# Patient Record
Sex: Female | Born: 1970 | Hispanic: Yes | Marital: Single | Smoking: Never smoker
Health system: Southern US, Community
[De-identification: ages and names within clinical notes are randomized; demographics above are authoritative.]

## PROBLEM LIST (undated history)

## (undated) DIAGNOSIS — N946 Dysmenorrhea, unspecified: Secondary | ICD-10-CM

## (undated) DIAGNOSIS — G43829 Menstrual migraine, not intractable, without status migrainosus: Secondary | ICD-10-CM

## (undated) DIAGNOSIS — N920 Excessive and frequent menstruation with regular cycle: Secondary | ICD-10-CM

## (undated) DIAGNOSIS — B019 Varicella without complication: Secondary | ICD-10-CM

## (undated) DIAGNOSIS — K219 Gastro-esophageal reflux disease without esophagitis: Secondary | ICD-10-CM

## (undated) HISTORY — DX: Varicella without complication: B01.9

## (undated) HISTORY — DX: Menstrual migraine, not intractable, without status migrainosus: G43.829

## (undated) HISTORY — DX: Excessive and frequent menstruation with regular cycle: N92.0

## (undated) HISTORY — DX: Dysmenorrhea, unspecified: N94.6

## (undated) HISTORY — DX: Gastro-esophageal reflux disease without esophagitis: K21.9

---

## 2002-01-13 HISTORY — PX: TUBAL LIGATION: SHX77

## 2011-12-18 ENCOUNTER — Emergency Department: Payer: Self-pay | Admitting: Internal Medicine

## 2011-12-20 LAB — BETA STREP CULTURE(ARMC)

## 2014-08-09 ENCOUNTER — Encounter: Payer: Self-pay | Admitting: Family Medicine

## 2014-08-09 ENCOUNTER — Ambulatory Visit (INDEPENDENT_AMBULATORY_CARE_PROVIDER_SITE_OTHER): Payer: No Typology Code available for payment source | Admitting: Family Medicine

## 2014-08-09 ENCOUNTER — Encounter (INDEPENDENT_AMBULATORY_CARE_PROVIDER_SITE_OTHER): Payer: Self-pay

## 2014-08-09 VITALS — BP 110/72 | HR 73 | Temp 97.7°F | Ht 62.25 in | Wt 117.2 lb

## 2014-08-09 DIAGNOSIS — R1013 Epigastric pain: Secondary | ICD-10-CM | POA: Diagnosis not present

## 2014-08-09 DIAGNOSIS — R030 Elevated blood-pressure reading, without diagnosis of hypertension: Secondary | ICD-10-CM

## 2014-08-09 DIAGNOSIS — Z13220 Encounter for screening for lipoid disorders: Secondary | ICD-10-CM | POA: Diagnosis not present

## 2014-08-09 DIAGNOSIS — Z0000 Encounter for general adult medical examination without abnormal findings: Secondary | ICD-10-CM | POA: Diagnosis not present

## 2014-08-09 LAB — CBC
HEMATOCRIT: 37.4 % (ref 36.0–46.0)
HEMOGLOBIN: 12.5 g/dL (ref 12.0–15.0)
MCHC: 33.4 g/dL (ref 30.0–36.0)
MCV: 92.2 fl (ref 78.0–100.0)
Platelets: 295 10*3/uL (ref 150.0–400.0)
RBC: 4.06 Mil/uL (ref 3.87–5.11)
RDW: 14.4 % (ref 11.5–15.5)
WBC: 9.1 10*3/uL (ref 4.0–10.5)

## 2014-08-09 LAB — COMPLETE METABOLIC PANEL WITH GFR
ALT: 8 U/L (ref 6–29)
AST: 17 U/L (ref 10–35)
Albumin: 4.2 g/dL (ref 3.6–5.1)
Alkaline Phosphatase: 49 U/L (ref 33–115)
BUN: 12 mg/dL (ref 7–25)
CO2: 24 meq/L (ref 20–31)
Calcium: 9 mg/dL (ref 8.6–10.2)
Chloride: 102 mEq/L (ref 98–110)
Creat: 0.67 mg/dL (ref 0.50–1.10)
GFR, Est African American: >89 mL/min (ref >=60)
GFR, Est Non African American: >89 mL/min (ref >=60)
Glucose, Bld: 80 mg/dL (ref 65–99)
POTASSIUM: 4.4 meq/L (ref 3.5–5.3)
SODIUM: 135 meq/L (ref 135–146)
TOTAL PROTEIN: 7.1 g/dL (ref 6.1–8.1)
Total Bilirubin: 0.6 mg/dL (ref 0.2–1.2)

## 2014-08-09 LAB — LIPID PANEL
CHOL/HDL RATIO: 2
Cholesterol: 200 mg/dL (ref 0–200)
HDL: 81.9 mg/dL (ref >39.00)
LDL CALC: 102 mg/dL — AB (ref 0–99)
NonHDL: 118.1
Triglycerides: 83 mg/dL (ref 0.0–149.0)
VLDL: 16.6 mg/dL (ref 0.0–40.0)

## 2014-08-09 LAB — LIPASE: LIPASE: 26 U/L (ref 11.0–59.0)

## 2014-08-09 MED ORDER — OMEPRAZOLE 40 MG PO CPDR: 40.0000 mg | DELAYED_RELEASE_CAPSULE | Freq: Every day | ORAL | Status: DC

## 2014-08-09 NOTE — Assessment & Plan Note (Signed)
Pap smear and immunizations up-to-date. Patient need a mammogram. Order placed.

## 2014-08-09 NOTE — Assessment & Plan Note (Signed)
History exam consistent with gastritis. Treating with omeprazole. Rx given today. Patient is to avoid alcohol and to stop NSAID use. Patient to follow-up in one month.

## 2014-08-09 NOTE — Patient Instructions (Signed)
It was nice to see you today.  Take the medication daily as prescribed.  Follow up in 1 month.  Take care  Dr. Lacinda Axon

## 2014-08-09 NOTE — Progress Notes (Signed)
   Subjective:    Patient ID: Denise Gordon, female    DOB: [birth year], 44 y.o.   MRN: [id number]  HPI 44 year old female presents to clinic today to establish care.  Gyn concerns/Preventative healthcare  Regular periods: Yes.   Sexually active: yes.   Contraception or hormonal therapy: No. S/p Tubal ligation.   Last mammogram: 2014; Normal.   Breast mass or concerns: No.    Last pap smear: 2014.   Immunizations: Tetatus (2015); No indication for Pneumococcal vaccine.   History of elevated BP  Patient reports that she had recently elevated blood pressure while at work.  She has no history of this.  She is on no medications for high blood pressure.  She is otherwise feeling well; no reports of chest pain, shortness of breath.  Epigastric pain  Patient reports that she has had epigastric discomfort for the past 3 weeks intermittently.  Pain is mild to moderate severity.  No known relieving factors.  She has noted that he has been worse with certain meals and after eating.  Additionally, she reports that she takes NSAIDs on a regular basis (5-6 tablets of ibuprofen 3 times a week; she states she has done this for years).  She also reports some associated reflux.  No nausea or vomiting. No reports of hematochezia or melanoma.  PMH, Surgical Hx, Family Hx, Social History reviewed and updated as below.  Past Medical History  Diagnosis Date  . Blood pressure elevated without history of HTN    Past Surgical History  Procedure Laterality Date  . Tubal ligation     Family History  Problem Relation Age of Onset  . Hypertension Mother   . Cancer Maternal Grandmother     ovary/uterus   History   Social History  . Marital Status: Married    Spouse Name: N/A  . Number of Children: N/A  . Years of Education: N/A   Social History Main Topics  . Smoking status: Never Smoker   . Smokeless tobacco: Never Used  . Alcohol Use: No  . Drug Use: No  . Sexual  Activity: Yes   Other Topics Concern  . None   Social History Narrative  . None   Review of Systems Per HPI. All other systems negative.    Objective:   Physical Exam  Constitutional: She is oriented to person, place, and time. She appears well-developed and well-nourished. No distress.  HENT:  Head: Normocephalic.  Nose: Nose normal.  Mouth/Throat: Oropharynx is clear and moist. No oropharyngeal exudate.  Normal TM's bilaterally.   Eyes: No scleral icterus.  Neck: Neck supple. No thyromegaly present.  Cardiovascular: Normal rate and regular rhythm.   No murmur heard. Pulmonary/Chest: Effort normal and breath sounds normal. No respiratory distress. She has no wheezes. She has no rales.  Abdominal: Soft. She exhibits no distension. There is no rebound and no guarding.  Tender to palpation in the epigastric region.   Musculoskeletal: She exhibits no edema.  Lymphadenopathy:    She has no cervical adenopathy.  Neurological: She is alert and oriented to person, place, and time.  Skin: Skin is warm and dry. No rash noted.  Psychiatric: She has a normal mood and affect.  Vitals reviewed.     Assessment & Plan:  See Problem List

## 2014-08-10 ENCOUNTER — Telehealth: Payer: Self-pay

## 2014-08-10 NOTE — Telephone Encounter (Signed)
Called pt about lab results and she was happy with those results. Pt had a mildly elevated cholesterol but nothing to be alarmed about.

## 2014-08-11 ENCOUNTER — Telehealth: Payer: Self-pay | Admitting: Family Medicine

## 2014-08-11 NOTE — Telephone Encounter (Signed)
Pt is wanting to change providers from Dr Lacinda Axon to Dr Danise Mina because she feels more comfortable speaking in [place].  Call back number is [phone]. thanks

## 2014-08-11 NOTE — Telephone Encounter (Signed)
Pt would like to transfer providers from Dr Lacinda Axon, to Dr Danise Mina because she feels more comfortable speaking in [place]. I told her that at both practices she can get an interpreter but she said she wishes to change pcp.  Best phone number is [phone]. Thank you for your time.

## 2014-08-11 NOTE — Telephone Encounter (Signed)
Ok by me if ok by current PCP

## 2014-08-29 NOTE — Telephone Encounter (Signed)
Pt scheduled for 12-12. Pt aware

## 2014-09-01 ENCOUNTER — Telehealth: Payer: Self-pay

## 2014-09-01 LAB — HM MAMMOGRAPHY: HM Mammogram: NEGATIVE

## 2014-09-01 NOTE — Telephone Encounter (Signed)
Pt was called to see if she had followed up with her mammogram . This appt is with [facility].

## 2014-09-08 ENCOUNTER — Encounter: Payer: Self-pay | Admitting: Internal Medicine

## 2014-09-11 ENCOUNTER — Ambulatory Visit: Payer: No Typology Code available for payment source | Admitting: Family Medicine

## 2014-12-25 ENCOUNTER — Ambulatory Visit (INDEPENDENT_AMBULATORY_CARE_PROVIDER_SITE_OTHER): Payer: No Typology Code available for payment source | Admitting: Family Medicine

## 2014-12-25 ENCOUNTER — Encounter: Payer: Self-pay | Admitting: Family Medicine

## 2014-12-25 ENCOUNTER — Encounter (INDEPENDENT_AMBULATORY_CARE_PROVIDER_SITE_OTHER): Payer: Self-pay

## 2014-12-25 VITALS — BP 112/68 | HR 75 | Temp 98.4°F | Ht 61.75 in | Wt 117.0 lb

## 2014-12-25 DIAGNOSIS — N920 Excessive and frequent menstruation with regular cycle: Secondary | ICD-10-CM | POA: Insufficient documentation

## 2014-12-25 DIAGNOSIS — M79675 Pain in left toe(s): Secondary | ICD-10-CM

## 2014-12-25 DIAGNOSIS — K219 Gastro-esophageal reflux disease without esophagitis: Secondary | ICD-10-CM | POA: Diagnosis not present

## 2014-12-25 DIAGNOSIS — G43829 Menstrual migraine, not intractable, without status migrainosus: Secondary | ICD-10-CM

## 2014-12-25 DIAGNOSIS — N943 Premenstrual tension syndrome: Secondary | ICD-10-CM

## 2014-12-25 DIAGNOSIS — N946 Dysmenorrhea, unspecified: Secondary | ICD-10-CM | POA: Insufficient documentation

## 2014-12-25 DIAGNOSIS — N921 Excessive and frequent menstruation with irregular cycle: Secondary | ICD-10-CM | POA: Insufficient documentation

## 2014-12-25 DIAGNOSIS — G43909 Migraine, unspecified, not intractable, without status migrainosus: Secondary | ICD-10-CM | POA: Insufficient documentation

## 2014-12-25 HISTORY — DX: Gastro-esophageal reflux disease without esophagitis: K21.9

## 2014-12-25 LAB — CBC WITH DIFFERENTIAL/PLATELET
BASOS PCT: 0.4 % (ref 0.0–3.0)
Basophils Absolute: 0 10*3/uL (ref 0.0–0.1)
EOS ABS: 0.1 10*3/uL (ref 0.0–0.7)
Eosinophils Relative: 1.2 % (ref 0.0–5.0)
HCT: 39.4 % (ref 36.0–46.0)
Hemoglobin: 12.9 g/dL (ref 12.0–15.0)
Lymphocytes Relative: 27 % (ref 12.0–46.0)
Lymphs Abs: 1.5 10*3/uL (ref 0.7–4.0)
MCHC: 32.8 g/dL (ref 30.0–36.0)
MCV: 91.4 fl (ref 78.0–100.0)
MONO ABS: 0.3 10*3/uL (ref 0.1–1.0)
Monocytes Relative: 5.5 % (ref 3.0–12.0)
Neutro Abs: 3.6 10*3/uL (ref 1.4–7.7)
Neutrophils Relative %: 65.9 % (ref 43.0–77.0)
Platelets: 329 10*3/uL (ref 150.0–400.0)
RBC: 4.31 Mil/uL (ref 3.87–5.11)
RDW: 14.3 % (ref 11.5–15.5)
WBC: 5.4 10*3/uL (ref 4.0–10.5)

## 2014-12-25 MED ORDER — DICLOFENAC SODIUM 1 % TD GEL: 1.0000 "application " | Freq: Three times a day (TID) | TRANSDERMAL | Status: DC

## 2014-12-25 NOTE — Patient Instructions (Addendum)
Nice to meet you today. Call us with questions labwork today. Pass by our referral coordinator to schedule pelvic ultrasound Try voltaren gel for topical anti inflammatory to toe - possible inflamed ganglion cyst.   Dysmenorrhea Menstrual cramps (dysmenorrhea) are caused by the muscles of the uterus tightening (contracting) during a menstrual period. For some women, this discomfort is merely bothersome. For others, dysmenorrhea can be severe enough to interfere with everyday activities for a few days each month. Primary dysmenorrhea is menstrual cramps that last a couple of days when you start having menstrual periods or soon after. This often begins after a teenager starts having her period. As a woman gets older or has a baby, the cramps will usually lessen or disappear. Secondary dysmenorrhea begins later in life, lasts longer, and the pain may be stronger than primary dysmenorrhea. The pain may start before the period and last a few days after the period.  CAUSES  Dysmenorrhea is usually caused by an underlying problem, such as:  The tissue lining the uterus grows outside of the uterus in other areas of the body (endometriosis).  The endometrial tissue, which normally lines the uterus, is found in or grows into the muscular walls of the uterus (adenomyosis).  The pelvic blood vessels are engorged with blood just before the menstrual period (pelvic congestive syndrome).  Overgrowth of cells (polyps) in the lining of the uterus or cervix.  Falling down of the uterus (prolapse) because of loose or stretched ligaments.  Depression.  Bladder problems, infection, or inflammation.  Problems with the intestine, a tumor, or irritable bowel syndrome.  Cancer of the female organs or bladder.  A severely tipped uterus.  A very tight opening or closed cervix.  Noncancerous tumors of the uterus (fibroids).  Pelvic inflammatory disease (PID).  Pelvic scarring (adhesions) from a previous  surgery.  Ovarian cyst.  An intrauterine device (IUD) used for birth control. RISK FACTORS You may be at greater risk of dysmenorrhea if:  You are younger than age 70.  You started puberty early.  You have irregular or heavy bleeding.  You have never given birth.  You have a family history of this problem.  You are a smoker. SIGNS AND SYMPTOMS   Cramping or throbbing pain in your lower abdomen.  Headaches.  Lower back pain.  Nausea or vomiting.  Diarrhea.  Sweating or dizziness.  Loose stools. DIAGNOSIS  A diagnosis is based on your history, symptoms, physical exam, diagnostic tests, or procedures. Diagnostic tests or procedures may include:  Blood tests.  Ultrasonography.  An examination of the lining of the uterus (dilation and curettage, D&C).  An examination inside your abdomen or pelvis with a scope (laparoscopy).  X-rays.  CT scan.  MRI.  An examination inside the bladder with a scope (cystoscopy).  An examination inside the intestine or stomach with a scope (colonoscopy, gastroscopy). TREATMENT  Treatment depends on the cause of the dysmenorrhea. Treatment may include:  Pain medicine prescribed by your health care provider.  Birth control pills or an IUD with progesterone hormone in it.  Hormone replacement therapy.  Nonsteroidal anti-inflammatory drugs (NSAIDs). These may help stop the production of prostaglandins.  Surgery to remove adhesions, endometriosis, ovarian cyst, or fibroids.  Removal of the uterus (hysterectomy).  Progesterone shots to stop the menstrual period.  Cutting the nerves on the sacrum that go to the female organs (presacral neurectomy).  Electric current to the sacral nerves (sacral nerve stimulation).  Antidepressant medicine.  Psychiatric therapy, counseling, or group  therapy.  Exercise and physical therapy.  Meditation and yoga therapy.  Acupuncture. HOME CARE INSTRUCTIONS   Only take  over-the-counter or prescription medicines as directed by your health care provider.  Place a heating pad or hot water bottle on your lower back or abdomen. Do not sleep with the heating pad.  Use aerobic exercises, walking, swimming, biking, and other exercises to help lessen the cramping.  Massage to the lower back or abdomen may help.  Stop smoking.  Avoid alcohol and caffeine. SEEK MEDICAL CARE IF:   Your pain does not get better with medicine.  You have pain with sexual intercourse.  Your pain increases and is not controlled with medicines.  You have abnormal vaginal bleeding with your period.  You develop nausea or vomiting with your period that is not controlled with medicine. SEEK IMMEDIATE MEDICAL CARE IF:  You pass out.    This information is not intended to replace advice given to you by your health care provider. Make sure you discuss any questions you have with your health care provider.   Document Released: 12/30/2004 Document Revised: 09/01/2012 Document Reviewed: 06/17/2012 Elsevier Interactive Patient Education [employer].

## 2014-12-25 NOTE — Assessment & Plan Note (Signed)
With dysmenorrhea and menstrual migraines. Discussed with patient. Check CBC, order transvaginal and pelvic US to r/o fibroids as cause then discuss OCP vs IUD. Pt agrees with plan.

## 2014-12-25 NOTE — Assessment & Plan Note (Signed)
?  intermittent inflamed ganglion cyst. rec prn voltaren gel - if no improvement will refer to podiatry. Pt agrees with plan.

## 2014-12-25 NOTE — Assessment & Plan Note (Signed)
Stable off NSAIDs.

## 2014-12-25 NOTE — Progress Notes (Signed)
BP 112/68 mmHg  Pulse 75  Temp(Src) 98.4 F (36.9 C) (Oral)  Ht 5' 1.75" (1.568 m)  Wt 117 lb (53.071 kg)  BMI 21.59 kg/m2  SpO2 99%  LMP 12/18/2014   CC: new pt to establish care  Subjective:    Patient ID: Denise Gordon, female    DOB: January 18, 1970, 44 y.o.   MRN: [id number]  HPI: Denise Gordon is a 44 y.o. female presenting on 12/25/2014 for Establish Care and Flu Vaccine   Transfer of care from Dr Lacinda Axon at [place] - pt desired spanish speaking provider. Presents with BF Randy today. Prior lived in [place], well woman with PCP.   Dysmenorrhea - present over the past year. + headache described as migraines 1 wk prior and 1 wk afterwards. HA described as frontal, not activity limiting. Nausea/vomiting with them. + photo/phonophobia. HA controlled with tylenol. Significant heavy bleeding first 2 days, bleeding continues for 2 more days. Goes through several packets of pads per period. No intermenstrual bleeding. H/o migraines previously over last 5 yrs.   Last well woman exam with pap was 2 yrs ago.   Also with intermittent small nodule L great toe that is bothersome. Denies inciting trauma/injury.   S/p BTL. LMP 12/14/2014. Last pap smear 2014. Had normal TSH 4-5 mo ago at work.   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 Interim medical history since our last visit reviewed. Allergies and medications reviewed and updated. No current outpatient prescriptions on file prior to visit.   No current facility-administered medications on file prior to visit.    Review of Systems Per HPI unless specifically indicated in ROS section     Objective:    BP 112/68 mmHg  Pulse 75  Temp(Src) 98.4 F (36.9 C) (Oral)  Ht 5' 1.75" (1.568 m)  Wt 117 lb (53.071 kg)  BMI 21.59 kg/m2  SpO2 99%  LMP  12/18/2014  Wt Readings from Last 3 Encounters:  12/25/14 117 lb (53.071 kg)  08/09/14 117 lb 4 oz (53.184 kg)    Physical Exam  Constitutional: She is oriented to person, place, and time. She appears well-developed and well-nourished. No distress.  HENT:  Mouth/Throat: Oropharynx is clear and moist. No oropharyngeal exudate.  Eyes: Conjunctivae and EOM are normal. Pupils are equal, round, and reactive to light. No scleral icterus.  Neck: Normal range of motion. Neck supple. No thyromegaly present.  Cardiovascular: Normal rate, regular rhythm, normal heart sounds and intact distal pulses.   No murmur heard. Pulmonary/Chest: Effort normal and breath sounds normal. No respiratory distress. She has no wheezes. She has no rales.  Abdominal: Soft. Normal appearance and bowel sounds are normal. She exhibits no distension and no mass. There is no hepatosplenomegaly. There is no tenderness. There is no rigidity, no rebound, no guarding, no CVA tenderness and negative Murphy's sign.  Musculoskeletal: She exhibits no edema.  FROM at bilateral toes. Small palpable tender nodule left dorsal toe just distal to MTP. No pain with axial loading. 2+ DP bilaterally  Lymphadenopathy:    She has no cervical adenopathy.  Neurological: She is alert and oriented to person, place, and time.  Skin: Skin is warm and dry. No rash noted.  Psychiatric: She has a normal mood and affect.  Nursing note and vitals reviewed.  Results for orders placed or performed in visit  on 09/05/14  [facility] MAMMOGRAPHY  Result Value Ref Range   [facility] Mammogram Negative       Assessment & Plan:   Problem List Items Addressed This Visit    Toe pain, left    ?intermittent inflamed ganglion cyst. rec prn voltaren gel - if no improvement will refer to podiatry. Pt agrees with plan.      Menstrual migraine - Primary    Discussed - pt feels stable on prn tylenol. Pt avoids NSAIDs 2/2 GERD history      Menorrhagia with regular cycle     With dysmenorrhea and menstrual migraines. Discussed with patient. Check CBC, order transvaginal and pelvic US to r/o fibroids as cause then discuss OCP vs IUD. Pt agrees with plan.      Relevant Orders   CBC with Differential/Platelet   US Pelvis Complete   US Transvaginal Non-OB   GERD (gastroesophageal reflux disease)    Stable off NSAIDs.          Follow up plan: Return if symptoms worsen or fail to improve.

## 2014-12-25 NOTE — Progress Notes (Signed)
Pre visit review using our clinic review tool, if applicable. No additional management support is needed unless otherwise documented below in the visit note. 

## 2014-12-25 NOTE — Assessment & Plan Note (Addendum)
Discussed - pt feels stable on prn tylenol. Pt avoids NSAIDs 2/2 GERD history

## 2014-12-28 ENCOUNTER — Ambulatory Visit: Payer: No Typology Code available for payment source

## 2015-01-05 ENCOUNTER — Ambulatory Visit
Admission: RE | Admit: 2015-01-05 | Discharge: 2015-01-05 | Disposition: A | Discharge status: Home / Self Care | Payer: No Typology Code available for payment source | Source: Ambulatory Visit | Attending: Family Medicine | Admitting: Family Medicine

## 2015-01-05 ENCOUNTER — Ambulatory Visit: Payer: No Typology Code available for payment source

## 2015-01-05 DIAGNOSIS — D259 Leiomyoma of uterus, unspecified: Secondary | ICD-10-CM | POA: Insufficient documentation

## 2015-01-05 DIAGNOSIS — N920 Excessive and frequent menstruation with regular cycle: Secondary | ICD-10-CM | POA: Insufficient documentation

## 2015-01-11 ENCOUNTER — Telehealth: Payer: Self-pay | Admitting: Family Medicine

## 2015-01-11 ENCOUNTER — Other Ambulatory Visit: Payer: Self-pay | Admitting: Family Medicine

## 2015-01-11 DIAGNOSIS — D219 Benign neoplasm of connective and other soft tissue, unspecified: Secondary | ICD-10-CM | POA: Insufficient documentation

## 2015-01-11 DIAGNOSIS — N920 Excessive and frequent menstruation with regular cycle: Secondary | ICD-10-CM

## 2015-01-11 DIAGNOSIS — D259 Leiomyoma of uterus, unspecified: Secondary | ICD-10-CM

## 2015-01-11 NOTE — Telephone Encounter (Signed)
Spoke with patient.

## 2015-01-11 NOTE — Telephone Encounter (Signed)
PT CALLED RE:  RESULTS, Best number [phone] / lt

## 2015-01-11 NOTE — Telephone Encounter (Signed)
Patient returned Kim's call about her [place] results.  Please call patient back at [phone].

## 2015-01-24 ENCOUNTER — Encounter: Payer: Self-pay | Admitting: Obstetrics & Gynecology

## 2015-01-24 ENCOUNTER — Ambulatory Visit (INDEPENDENT_AMBULATORY_CARE_PROVIDER_SITE_OTHER): Payer: No Typology Code available for payment source | Admitting: Obstetrics & Gynecology

## 2015-01-24 ENCOUNTER — Telehealth: Payer: Self-pay | Admitting: Family Medicine

## 2015-01-24 VITALS — BP 122/78 | HR 90 | Resp 18 | Ht 63.0 in | Wt 118.0 lb

## 2015-01-24 DIAGNOSIS — D259 Leiomyoma of uterus, unspecified: Secondary | ICD-10-CM | POA: Diagnosis not present

## 2015-01-24 DIAGNOSIS — Z1151 Encounter for screening for human papillomavirus (HPV): Secondary | ICD-10-CM

## 2015-01-24 DIAGNOSIS — Z124 Encounter for screening for malignant neoplasm of cervix: Secondary | ICD-10-CM

## 2015-01-24 DIAGNOSIS — Z113 Encounter for screening for infections with a predominantly sexual mode of transmission: Secondary | ICD-10-CM | POA: Diagnosis not present

## 2015-01-24 DIAGNOSIS — Z01419 Encounter for gynecological examination (general) (routine) without abnormal findings: Secondary | ICD-10-CM | POA: Diagnosis not present

## 2015-01-24 DIAGNOSIS — Z0000 Encounter for general adult medical examination without abnormal findings: Secondary | ICD-10-CM

## 2015-01-24 MED ORDER — MISOPROSTOL 200 MCG PO TABS: ORAL_TABLET | ORAL | Status: DC

## 2015-01-24 NOTE — Progress Notes (Signed)
Subjective:    Denise Gordon is a 45 y.o. separated [place] P2 (48 yo son and 14 yo daughter, grandmother also)  female who presents for an annual exam. Her periods are very heavy and painful. Uses Tylenol.The patient is sexually active. GYN screening history: last pap: was normal. The patient wears seatbelts: yes. The patient participates in regular exercise: yes. Has the patient ever been transfused or tattooed?: yes. The patient reports that there is not domestic violence in her life.   Menstrual History: OB History    No data available      Menarche age: 18  Patient's last menstrual period was 12/18/2014.    The following portions of the patient's history were reviewed and updated as appropriate: allergies, current medications, past family history, past medical history, past social history, past surgical history and problem list.  Review of Systems Pertinent items noted in HPI and remainder of comprehensive ROS otherwise negative.  Works for [employer]. Monogamous for 2 months. Declines a flu vaccine. Last mammogram in [place] in 2016 Fall   Objective:    BP 122/78 mmHg  Pulse 90  Resp 18  Ht 5\' 3"  (1.6 m)  Wt 118 lb (53.524 kg)  BMI 20.91 kg/m2  LMP 12/18/2014  General Appearance:    Alert, cooperative, no distress, appears stated age  Head:    Normocephalic, without obvious abnormality, atraumatic  Eyes:    PERRL, conjunctiva/corneas clear, EOM's intact, fundi    benign, both eyes  Ears:    Normal TM's and external ear canals, both ears  Nose:   Nares normal, septum midline, mucosa normal, no drainage    or sinus tenderness  Throat:   Lips, mucosa, and tongue normal; teeth and gums normal  Neck:   Supple, symmetrical, trachea midline, no adenopathy;    thyroid:  no enlargement/tenderness/nodules; no carotid   bruit or JVD  Back:     Symmetric, no curvature, ROM normal, no CVA tenderness  Lungs:     Clear to auscultation bilaterally, respirations unlabored   Chest Wall:    No tenderness or deformity   Heart:    Regular rate and rhythm, S1 and S2 normal, no murmur, rub   or gallop  Breast Exam:    No tenderness, masses, or nipple abnormality  Abdomen:     Soft, non-tender, bowel sounds active all four quadrants,    no masses, no organomegaly  Genitalia:    Normal female without lesion, discharge or tenderness, 10 week size, mobile, NT uterus, no adnexal masses     Extremities:   Extremities normal, atraumatic, no cyanosis or edema  Pulses:   2+ and symmetric all extremities  Skin:   Skin color, texture, turgor normal, no rashes or lesions  Lymph nodes:   Cervical, supraclavicular, and axillary nodes normal  Neurologic:   CNII-XII intact, normal strength, sensation and reflexes    throughout  .    Assessment:    Healthy female exam.   Symptomatic fibroids   Plan:     Breast self exam technique reviewed and patient encouraged to perform self-exam monthly. Chlamydia specimen. GC specimen. Thin prep Pap smear. with cotesting Mirena for DUB at next visit. Pretreat with cytotec

## 2015-01-24 NOTE — Telephone Encounter (Signed)
Form on your desk  

## 2015-01-24 NOTE — Telephone Encounter (Signed)
Pt brought in a form for work that she needs filled out and signed. She said if she needs to come in for the ppw to be filled out you can call her and will schedule something. Best number to reach her is [phone]. Placing ppw in Dr. Bosie Clos rx tower.

## 2015-01-25 NOTE — Telephone Encounter (Signed)
Spoke with patient. It is for intermittent leave for migraines. In your IN box.

## 2015-01-25 NOTE — Telephone Encounter (Signed)
This was for FMLA forms. What is reason to be out of work?

## 2015-01-26 LAB — CYTOLOGY - PAP

## 2015-01-26 NOTE — Telephone Encounter (Addendum)
Patient notified and paperwork placed up front for patient's signature.

## 2015-01-26 NOTE — Telephone Encounter (Addendum)
Filled and in Kim's box. 

## 2015-01-30 ENCOUNTER — Telehealth: Payer: Self-pay | Admitting: Family Medicine

## 2015-01-30 NOTE — Telephone Encounter (Signed)
Dr Darnell Level Could you fill out page 2 part A question 1 it needs to be for 6 months or 1 year  In Dr Synthia Innocent IN BOX

## 2015-01-30 NOTE — Telephone Encounter (Signed)
Pt's friend dropped off flma that needs additional information.  It needs the credentials of Dr Darnell Level on the bottom of the first page and at his signature.  Also, on page 2, it is asking for the duration of the leave. Thank you  Please call pt when completions are made. Placing on Robin's desk

## 2015-01-31 NOTE — Telephone Encounter (Signed)
Filled and in my outbox

## 2015-02-01 NOTE — Telephone Encounter (Signed)
Pt pick up her copy Pt aware she will need to turn in paperwork  Copy for pt Copy for scan Copy for file

## 2015-02-14 DIAGNOSIS — Z7689 Persons encountering health services in other specified circumstances: Secondary | ICD-10-CM

## 2015-07-24 ENCOUNTER — Other Ambulatory Visit: Payer: Self-pay | Admitting: Family Medicine

## 2015-07-25 LAB — CMP12+LP+TP+TSH+6AC+CBC/D/PLT
A/G RATIO: 1.4 (ref 1.2–2.2)
ALT: 10 IU/L (ref 0–32)
AST: 20 IU/L (ref 0–40)
Albumin: 4.6 g/dL (ref 3.5–5.5)
Alkaline Phosphatase: 61 IU/L (ref 39–117)
BASOS ABS: 0 10*3/uL (ref 0.0–0.2)
BUN/Creatinine Ratio: 15 (ref 9–23)
BUN: 12 mg/dL (ref 6–24)
Basos: 0 %
Bilirubin Total: 0.3 mg/dL (ref 0.0–1.2)
CALCIUM: 8.9 mg/dL (ref 8.7–10.2)
CREATININE: 0.79 mg/dL (ref 0.57–1.00)
Chloride: 99 mmol/L (ref 96–106)
Chol/HDL Ratio: 2.9 ratio units (ref 0.0–4.4)
Cholesterol, Total: 219 mg/dL — ABNORMAL HIGH (ref 100–199)
EOS (ABSOLUTE): 0.1 10*3/uL (ref 0.0–0.4)
Eos: 1 %
Estimated CHD Risk: <0.5 times avg. (ref 0.0–1.0)
Free Thyroxine Index: 1.9 (ref 1.2–4.9)
GFR calc Af Amer: 104 mL/min/{1.73_m2} (ref >59)
GFR, EST NON AFRICAN AMERICAN: 90 mL/min/{1.73_m2} (ref >59)
GGT: 19 IU/L (ref 0–60)
GLOBULIN, TOTAL: 3.3 g/dL (ref 1.5–4.5)
GLUCOSE: 86 mg/dL (ref 65–99)
HDL: 76 mg/dL (ref >39)
Hematocrit: 42.4 % (ref 34.0–46.6)
Hemoglobin: 13.4 g/dL (ref 11.1–15.9)
IRON: 153 ug/dL (ref 27–159)
Immature Grans (Abs): 0 10*3/uL (ref 0.0–0.1)
Immature Granulocytes: 0 %
LDH: 204 IU/L (ref 119–226)
LDL Calculated: 106 mg/dL — ABNORMAL HIGH (ref 0–99)
LYMPHS ABS: 1.4 10*3/uL (ref 0.7–3.1)
Lymphs: 26 %
MCH: 30.4 pg (ref 26.6–33.0)
MCHC: 31.6 g/dL (ref 31.5–35.7)
MCV: 96 fL (ref 79–97)
MONOS ABS: 0.3 10*3/uL (ref 0.1–0.9)
Monocytes: 5 %
NEUTROS ABS: 3.7 10*3/uL (ref 1.4–7.0)
Neutrophils: 68 %
PHOSPHORUS: 3.7 mg/dL (ref 2.5–4.5)
PLATELETS: 321 10*3/uL (ref 150–379)
POTASSIUM: 4 mmol/L (ref 3.5–5.2)
RBC: 4.41 x10E6/uL (ref 3.77–5.28)
RDW: 13.8 % (ref 12.3–15.4)
Sodium: 139 mmol/L (ref 134–144)
T3 UPTAKE RATIO: 27 % (ref 24–39)
T4 TOTAL: 7 ug/dL (ref 4.5–12.0)
TOTAL PROTEIN: 7.9 g/dL (ref 6.0–8.5)
TSH: 1.71 u[IU]/mL (ref 0.450–4.500)
Triglycerides: 184 mg/dL — ABNORMAL HIGH (ref 0–149)
URIC ACID: 3.5 mg/dL (ref 2.5–7.1)
VLDL Cholesterol Cal: 37 mg/dL (ref 5–40)
WBC: 5.4 10*3/uL (ref 3.4–10.8)

## 2015-07-25 LAB — HGB A1C W/O EAG: HEMOGLOBIN A1C: 5.3 % (ref 4.8–5.6)

## 2015-07-26 ENCOUNTER — Encounter: Payer: Self-pay | Admitting: Family Medicine

## 2015-07-26 ENCOUNTER — Ambulatory Visit (INDEPENDENT_AMBULATORY_CARE_PROVIDER_SITE_OTHER): Payer: No Typology Code available for payment source | Admitting: Family Medicine

## 2015-07-26 VITALS — BP 102/62 | HR 74 | Temp 98.2°F | Wt 121.8 lb

## 2015-07-26 DIAGNOSIS — G43829 Menstrual migraine, not intractable, without status migrainosus: Secondary | ICD-10-CM

## 2015-07-26 DIAGNOSIS — N920 Excessive and frequent menstruation with regular cycle: Secondary | ICD-10-CM

## 2015-07-26 DIAGNOSIS — D259 Leiomyoma of uterus, unspecified: Secondary | ICD-10-CM

## 2015-07-26 DIAGNOSIS — E041 Nontoxic single thyroid nodule: Secondary | ICD-10-CM | POA: Diagnosis not present

## 2015-07-26 DIAGNOSIS — N951 Menopausal and female climacteric states: Secondary | ICD-10-CM

## 2015-07-26 DIAGNOSIS — N943 Premenstrual tension syndrome: Secondary | ICD-10-CM

## 2015-07-26 MED ORDER — RIZATRIPTAN BENZOATE 5 MG PO TABS
5.0000 mg | ORAL_TABLET | ORAL | Status: DC | PRN
Start: 2015-07-26 — End: 2016-03-28

## 2015-07-26 NOTE — Progress Notes (Signed)
Pre visit review using our clinic review tool, if applicable. No additional management support is needed unless otherwise documented below in the visit note. 

## 2015-07-26 NOTE — Assessment & Plan Note (Signed)
Discussed treatment - SSRI vs black cohosh vs HRT. rec against HRT at this time (in fibroid and migraine history). Reasonable to try daily black cohosh. Monitor menstrual bleeding and migraines on estrogenic supplement. If not tolerated, start citalopram goal dose 20mg  daily.

## 2015-07-26 NOTE — Assessment & Plan Note (Signed)
Saw Dr Hulan Fray earlier this year. Decided not to proceed with mirena IUD, and dysmenorrhea did improve some.

## 2015-07-26 NOTE — Assessment & Plan Note (Addendum)
With some intermittent irritative symptoms - check thyroid US.

## 2015-07-26 NOTE — Progress Notes (Addendum)
BP 102/62 mmHg  Pulse 74  Temp(Src) 98.2 F (36.8 C) (Oral)  Wt 121 lb 12 oz (55.225 kg)  SpO2 99%   CC: discuss several issues  Subjective:    Patient ID: Denise Gordon, female    DOB: 1970/04/21, 45 y.o.   MRN: [id number]  HPI: Denise Gordon is a 45 y.o. female presenting on 07/26/2015 for Kidneys   Migraines - worsening over last several months. Has needed to seek care at [facility] twice, treated with rizatriptan which helps. Requests refill. Severe migraine ~every 2 months, associated with menstrual cycle. Tylenol no longer effective.  Perimenopause - noticing more irregular periods over last year (Q2 months). Heavy flow first few days. Dysmenorrhea has improved some. Noticing monthly hot flashes around cycles as well as frequently throughout the day. LMP 07/14/2015  Irritative throat/neck symptoms - previous mild dysphagia and fullness now resolved. Intermittent dry mouth with unexplained dry coughing.   Preventative: Well woman 12/2014 with Dr Hulan Fray Recent labwork through work health program - reviewed with patient.  Relevant past medical, surgical, family and social history reviewed and updated as indicated. Interim medical history since our last visit reviewed. Allergies and medications reviewed and updated. Current Outpatient Prescriptions on File Prior to Visit  Medication Sig  . acetaminophen (TYLENOL) 500 MG tablet Take 500 mg by mouth every 6 (six) hours as needed.   No current facility-administered medications on file prior to visit.    Review of Systems Per HPI unless specifically indicated in ROS section     Objective:    BP 102/62 mmHg  Pulse 74  Temp(Src) 98.2 F (36.8 C) (Oral)  Wt 121 lb 12 oz (55.225 kg)  SpO2 99%  Wt Readings from Last 3 Encounters:  07/26/15 121 lb 12 oz (55.225 kg)  01/24/15 118 lb (53.524 kg)  12/25/14 117 lb (53.071 kg)    Physical Exam  Constitutional: She appears well-developed and well-nourished. No distress.  HENT:    Mouth/Throat: Oropharynx is clear and moist. No oropharyngeal exudate.  Eyes: Conjunctivae and EOM are normal. Pupils are equal, round, and reactive to light.  Neck: Normal range of motion. Neck supple. No thyromegaly (?R thyroid nodule) present.  Cardiovascular: Normal rate, regular rhythm, normal heart sounds and intact distal pulses.   No murmur heard. Pulmonary/Chest: Effort normal and breath sounds normal. No respiratory distress. She has no wheezes. She has no rales.  Musculoskeletal: She exhibits no edema.  Lymphadenopathy:    She has no cervical adenopathy.  Skin: Skin is warm and dry. No rash noted.  Psychiatric: She has a normal mood and affect.  Nursing note and vitals reviewed.  Results for orders placed or performed in visit on 07/24/15  CMP12+LP+TP+TSH+6AC+CBC/D/Plt  Result Value Ref Range   Glucose 86 65 - 99 mg/dL   Uric Acid 3.5 2.5 - 7.1 mg/dL   BUN 12 6 - 24 mg/dL   Creatinine, Ser 0.79 0.57 - 1.00 mg/dL   GFR calc non Af Amer 90 >59 mL/min/1.73   GFR calc Af Amer 104 >59 mL/min/1.73   BUN/Creatinine Ratio 15 9 - 23   Sodium 139 134 - 144 mmol/L   Potassium 4.0 3.5 - 5.2 mmol/L   Chloride 99 96 - 106 mmol/L   Calcium 8.9 8.7 - 10.2 mg/dL   Phosphorus 3.7 2.5 - 4.5 mg/dL   Total Protein 7.9 6.0 - 8.5 g/dL   Albumin 4.6 3.5 - 5.5 g/dL   Globulin, Total 3.3 1.5 - 4.5 g/dL  Albumin/Globulin Ratio 1.4 1.2 - 2.2   Bilirubin Total 0.3 0.0 - 1.2 mg/dL   Alkaline Phosphatase 61 39 - 117 IU/L   LDH 204 119 - 226 IU/L   AST 20 0 - 40 IU/L   ALT 10 0 - 32 IU/L   GGT 19 0 - 60 IU/L   Iron 153 27 - 159 ug/dL   Cholesterol, Total 219 (H) 100 - 199 mg/dL   Triglycerides 184 (H) 0 - 149 mg/dL   HDL 76 >39 mg/dL   VLDL Cholesterol Cal 37 5 - 40 mg/dL   LDL Calculated 106 (H) 0 - 99 mg/dL   Chol/HDL Ratio 2.9 0.0 - 4.4 ratio units   Estimated CHD Risk  < 0.5 0.0 - 1.0  times avg.   TSH 1.710 0.450 - 4.500 uIU/mL   T4, Total 7.0 4.5 - 12.0 ug/dL   T3 Uptake Ratio 27  24 - 39 %   Free Thyroxine Index 1.9 1.2 - 4.9   WBC 5.4 3.4 - 10.8 x10E3/uL   RBC 4.41 3.77 - 5.28 x10E6/uL   Hemoglobin 13.4 11.1 - 15.9 g/dL   Hematocrit 42.4 34.0 - 46.6 %   MCV 96 79 - 97 fL   MCH 30.4 26.6 - 33.0 pg   MCHC 31.6 31.5 - 35.7 g/dL   RDW 13.8 12.3 - 15.4 %   Platelets 321 150 - 379 x10E3/uL   Neutrophils 68 %   Lymphs 26 %   Monocytes 5 %   Eos 1 %   Basos 0 %   Neutrophils Absolute 3.7 1.4 - 7.0 x10E3/uL   Lymphocytes Absolute 1.4 0.7 - 3.1 x10E3/uL   Monocytes Absolute 0.3 0.1 - 0.9 x10E3/uL   EOS (ABSOLUTE) 0.1 0.0 - 0.4 x10E3/uL   Basophils Absolute 0.0 0.0 - 0.2 x10E3/uL   Immature Granulocytes 0 %   Immature Grans (Abs) 0.0 0.0 - 0.1 x10E3/uL  Hgb A1c w/o eAG  Result Value Ref Range   Hgb A1c MFr Bld 5.3 4.8 - 5.6 %      Assessment & Plan:   Problem List Items Addressed This Visit    Menstrual migraine    relpax very effective - requests refill. Discussed use. Has not tried other triptan in the past.       Relevant Medications   rizatriptan (MAXALT) 5 MG tablet   Fibroids    Saw Dr Hulan Fray earlier this year. Decided not to proceed with mirena IUD, and dysmenorrhea did improve some.       Perimenopausal vasomotor symptoms - Primary    Discussed treatment - SSRI vs black cohosh vs HRT. rec against HRT at this time (in fibroid and migraine history). Reasonable to try daily black cohosh. Monitor menstrual bleeding and migraines on estrogenic supplement. If not tolerated, start citalopram goal dose 20mg  daily.      Right thyroid nodule    With some intermittent irritative symptoms - check thyroid US.       Relevant Orders   US THYROID   RESOLVED: Menorrhagia with regular cycle    This has improved.           Follow up plan: Return as needed.  Ria Bush, MD

## 2015-07-26 NOTE — Assessment & Plan Note (Signed)
This has improved.

## 2015-07-26 NOTE — Patient Instructions (Addendum)
Trate Black Cohosh suplemento sin receta, diario para los calores.  He rellenado relpax para migraa Su proximo mamograma sera 08/30/2015 en  Imaging  La llamaremos para cita para sonograma de tyroide.

## 2015-07-26 NOTE — Assessment & Plan Note (Signed)
relpax very effective - requests refill. Discussed use. Has not tried other triptan in the past.

## 2015-08-02 ENCOUNTER — Ambulatory Visit
Admission: RE | Admit: 2015-08-02 | Discharge: 2015-08-02 | Disposition: A | Discharge status: Home / Self Care | Payer: No Typology Code available for payment source | Source: Ambulatory Visit | Attending: Family Medicine | Admitting: Family Medicine

## 2015-08-02 DIAGNOSIS — E041 Nontoxic single thyroid nodule: Secondary | ICD-10-CM

## 2015-09-13 ENCOUNTER — Other Ambulatory Visit: Payer: Self-pay | Admitting: *Deleted

## 2015-09-13 ENCOUNTER — Inpatient Hospital Stay
Admission: RE | Admit: 2015-09-13 | Discharge: 2015-09-13 | Disposition: A | Discharge status: Home / Self Care | Payer: Self-pay | Source: Ambulatory Visit | Attending: *Deleted | Admitting: *Deleted

## 2015-09-13 ENCOUNTER — Other Ambulatory Visit: Payer: Self-pay | Admitting: Family Medicine

## 2015-09-13 DIAGNOSIS — Z1231 Encounter for screening mammogram for malignant neoplasm of breast: Secondary | ICD-10-CM

## 2015-09-13 DIAGNOSIS — Z9289 Personal history of other medical treatment: Secondary | ICD-10-CM

## 2015-10-03 ENCOUNTER — Ambulatory Visit
Admission: RE | Admit: 2015-10-03 | Discharge: 2015-10-03 | Disposition: A | Discharge status: Home / Self Care | Payer: No Typology Code available for payment source | Source: Ambulatory Visit | Attending: Family Medicine | Admitting: Family Medicine

## 2015-10-03 DIAGNOSIS — Z1231 Encounter for screening mammogram for malignant neoplasm of breast: Secondary | ICD-10-CM | POA: Diagnosis not present

## 2015-10-03 LAB — HM MAMMOGRAPHY

## 2015-10-05 ENCOUNTER — Telehealth: Payer: Self-pay | Admitting: Family Medicine

## 2015-10-05 ENCOUNTER — Encounter: Payer: Self-pay | Admitting: Emergency Medicine

## 2015-10-05 ENCOUNTER — Emergency Department: Payer: No Typology Code available for payment source

## 2015-10-05 ENCOUNTER — Emergency Department
Admission: EM | Admit: 2015-10-05 | Discharge: 2015-10-05 | Disposition: A | Discharge status: Home / Self Care | Payer: No Typology Code available for payment source | Attending: Student in an Organized Health Care Education/Training Program | Admitting: Student in an Organized Health Care Education/Training Program

## 2015-10-05 ENCOUNTER — Encounter: Payer: Self-pay | Admitting: *Deleted

## 2015-10-05 DIAGNOSIS — R0789 Other chest pain: Secondary | ICD-10-CM | POA: Diagnosis not present

## 2015-10-05 DIAGNOSIS — J45909 Unspecified asthma, uncomplicated: Secondary | ICD-10-CM | POA: Diagnosis not present

## 2015-10-05 DIAGNOSIS — Z791 Long term (current) use of non-steroidal anti-inflammatories (NSAID): Secondary | ICD-10-CM | POA: Insufficient documentation

## 2015-10-05 DIAGNOSIS — R079 Chest pain, unspecified: Secondary | ICD-10-CM

## 2015-10-05 DIAGNOSIS — M79661 Pain in right lower leg: Secondary | ICD-10-CM | POA: Diagnosis not present

## 2015-10-05 DIAGNOSIS — M79662 Pain in left lower leg: Secondary | ICD-10-CM | POA: Diagnosis not present

## 2015-10-05 LAB — CBC WITH DIFFERENTIAL/PLATELET
BASOS ABS: 0 10*3/uL (ref 0–0.1)
Basophils Relative: 0 %
EOS PCT: 1 %
Eosinophils Absolute: 0.1 10*3/uL (ref 0–0.7)
HEMATOCRIT: 39.8 % (ref 35.0–47.0)
Hemoglobin: 13.4 g/dL (ref 12.0–16.0)
LYMPHS ABS: 2.2 10*3/uL (ref 1.0–3.6)
LYMPHS PCT: 29 %
MCH: 31 pg (ref 26.0–34.0)
MCHC: 33.8 g/dL (ref 32.0–36.0)
MCV: 91.9 fL (ref 80.0–100.0)
MONO ABS: 0.5 10*3/uL (ref 0.2–0.9)
MONOS PCT: 6 %
NEUTROS ABS: 5 10*3/uL (ref 1.4–6.5)
Neutrophils Relative %: 64 %
PLATELETS: 258 10*3/uL (ref 150–440)
RBC: 4.33 MIL/uL (ref 3.80–5.20)
RDW: 13.2 % (ref 11.5–14.5)
WBC: 7.8 10*3/uL (ref 3.6–11.0)

## 2015-10-05 LAB — COMPREHENSIVE METABOLIC PANEL
ALT: 11 U/L — ABNORMAL LOW (ref 14–54)
AST: 22 U/L (ref 15–41)
Albumin: 4.2 g/dL (ref 3.5–5.0)
Alkaline Phosphatase: 53 U/L (ref 38–126)
Anion gap: 5 (ref 5–15)
BILIRUBIN TOTAL: 0.5 mg/dL (ref 0.3–1.2)
BUN: 17 mg/dL (ref 6–20)
CHLORIDE: 103 mmol/L (ref 101–111)
CO2: 29 mmol/L (ref 22–32)
Calcium: 9 mg/dL (ref 8.9–10.3)
Creatinine, Ser: 0.88 mg/dL (ref 0.44–1.00)
Glucose, Bld: 93 mg/dL (ref 65–99)
POTASSIUM: 4 mmol/L (ref 3.5–5.1)
Sodium: 137 mmol/L (ref 135–145)
TOTAL PROTEIN: 7.4 g/dL (ref 6.5–8.1)

## 2015-10-05 LAB — TROPONIN I: Troponin I: <0.03 ng/mL (ref <0.03)

## 2015-10-05 MED ORDER — DEXAMETHASONE SODIUM PHOSPHATE 10 MG/ML IJ SOLN
10.0000 mg | Freq: Once | INTRAMUSCULAR | Status: AC
  Administered 2015-10-05: 10 mg via INTRAVENOUS

## 2015-10-05 MED ORDER — ALBUTEROL SULFATE HFA 108 (90 BASE) MCG/ACT IN AERS: 2.0000 | INHALATION_SPRAY | Freq: Four times a day (QID) | RESPIRATORY_TRACT | 2 refills | Status: DC | PRN

## 2015-10-05 MED ORDER — NITROGLYCERIN 0.4 MG SL SUBL
0.4000 mg | SUBLINGUAL_TABLET | SUBLINGUAL | Status: DC | PRN
Start: 2015-10-05 — End: 2015-10-06
  Administered 2015-10-05: 0.4 mg via SUBLINGUAL
  Filled 2015-10-05: qty 1

## 2015-10-05 MED ORDER — DEXAMETHASONE SODIUM PHOSPHATE 10 MG/ML IJ SOLN
INTRAMUSCULAR | Status: AC
  Filled 2015-10-05: qty 1

## 2015-10-05 MED ORDER — NAPROXEN 375 MG PO TABS: 375.0000 mg | ORAL_TABLET | Freq: Two times a day (BID) | ORAL | 0 refills | Status: AC

## 2015-10-05 MED ORDER — FENTANYL CITRATE (PF) 100 MCG/2ML IJ SOLN
100.0000 ug | INTRAMUSCULAR | Status: DC | PRN
Start: 2015-10-05 — End: 2015-10-06
  Administered 2015-10-05: 100 ug via INTRAVENOUS
  Filled 2015-10-05: qty 2

## 2015-10-05 MED ORDER — IPRATROPIUM-ALBUTEROL 0.5-2.5 (3) MG/3ML IN SOLN
3.0000 mL | Freq: Once | RESPIRATORY_TRACT | Status: AC
  Administered 2015-10-05: 3 mL via RESPIRATORY_TRACT
  Filled 2015-10-05: qty 3

## 2015-10-05 MED ORDER — NAPROXEN 500 MG PO TABS
500.0000 mg | ORAL_TABLET | Freq: Once | ORAL | Status: AC
  Administered 2015-10-05: 500 mg via ORAL
  Filled 2015-10-05: qty 1

## 2015-10-05 MED ORDER — ACETAMINOPHEN 325 MG PO TABS
650.0000 mg | ORAL_TABLET | Freq: Once | ORAL | Status: AC
  Administered 2015-10-05: 650 mg via ORAL

## 2015-10-05 MED ORDER — PREDNISONE 20 MG PO TABS: 40.0000 mg | ORAL_TABLET | Freq: Every day | ORAL | 0 refills | Status: AC

## 2015-10-05 MED ORDER — ACETAMINOPHEN 325 MG PO TABS
ORAL_TABLET | ORAL | Status: AC
  Filled 2015-10-05: qty 2

## 2015-10-05 NOTE — Telephone Encounter (Signed)
[place]    --------------------------------------------------------------------------------   Patient Name: Denise Gordon  Gender: Female  DOB: 02/23/1970   Age: 45 Y 73 M 11 D  Return Phone Number: [phone] (Primary)  Address:     City/State/Zip:  [place]     Client [facility] Client  Client Site [place]  Physician Ria Bush - MD  Contact Type Call  Who Is Calling Patient / Member / Family / Caregiver  Call Type Triage / Clinical  Relationship To Patient Self  Return Phone Number [phone] (Primary)  Chief Complaint CHEST PAIN (>=21 years) - pain, pressure, heaviness or tightness  Reason for Call Symptomatic / Request for [place] is having chest pains.  Appointment Disposition EMR Appointment Not Necessary  Info pasted into Epic Yes  PreDisposition Did not know what to do  Translation No       Nurse Assessment  Nurse: Venetia Maxon, RN, Manuela Schwartz Date/Time (Eastern Time): 10/05/2015 2:52:56 PM  Confirm and document reason for call. If symptomatic, describe symptoms. You must click the next button to save text entered. ---Caller is having chest pains. for two months and has not seen a doctor for this. She was waiting for her mammogram . results were good. She had leg pains in both legs. mostly of calf    Has the patient traveled out of the country within the last 30 days? ---No    Does the patient have any new or worsening symptoms? ---Yes    Will a triage be completed? ---Yes    Related visit to physician within the last 2 weeks? ---No    Does the PT have any chronic conditions? (i.e. diabetes, asthma, etc.) ---Yes    List chronic conditions. ---Tylenol for chest pain / calf pains. migraines LMP 3 wks ago    Is the patient pregnant or possibly pregnant? (Ask all females between  the ages of 54-55) ---No    Is this a behavioral health or substance abuse call? ---No           Guidelines          Guideline Title Affirmed Question Affirmed Notes Nurse Date/Time Eilene Ghazi Time)  Leg Pain Chest Pain    Venetia Maxon, RN, Manuela Schwartz 10/05/2015 2:56:09 PM    Disp. Time Eilene Ghazi Time) Disposition Final User    10/05/2015 2:49:05 PM Send to Urgent Queue   Renato Shin      10/05/2015 2:57:12 PM Go to ED Now Yes Venetia Maxon, RN, Edwena Bunde Understands: Yes  Disagree/Comply: Comply       Care Advice Given Per Guideline        GO TO ED NOW: You need to be seen in the Emergency Department. Go to the ER at ___________ [facility] now. Drive carefully. * Another adult should drive. NOTE TO TRIAGER - DRIVING: * Please bring a list of your current medicines when you go to the Emergency Department (ER). CARE ADVICE given per Leg Pain (Adult) guideline.        --------------------------------------------------------------------------------            Referrals  [facility] - ED

## 2015-10-05 NOTE — Telephone Encounter (Signed)
Noted. Will await eval.  

## 2015-10-05 NOTE — ED Provider Notes (Signed)
[facility] Emergency Department Provider Note    None    (approximate)  I have reviewed the triage vital signs and the nursing notes.   HISTORY  Chief Complaint Chest Pain    HPI Denise Gordon is a 45 y.o. female pleasantfemale who presents with 2 months of chest pain and diffuse myalgias, only on her lower extremities. States that the chest pain is like a pressure in midsternal without radiation or shoulders. States that she has this pain every day and is worse at night. Denies any previous history of heart attack. Does not have any diaphoresis or nausea associated with this pain. Describes it as a strong burning sensation. States the pain is reproduced with movement. She does have a history of asthma but denies any shortness of breath at this time. She called her primary care physician to be evaluated for chest pain today but was directed to the ER. As any history of blood clots. Denies any use of OCP. She is a nonsmoker. Denies any cough or shortness of breath at this time. Denies any recent prolonged travel.   Past Medical History:  Diagnosis Date  . Chicken pox   . Dysmenorrhea   . GERD (gastroesophageal reflux disease) 12/25/2014  . Menorrhagia with regular cycle   . Menstrual migraine     Patient Active Problem List   Diagnosis Date Noted  . Perimenopausal vasomotor symptoms 07/26/2015  . Right thyroid nodule 07/26/2015  . Fibroids 01/11/2015  . Toe pain, left 12/25/2014  . GERD (gastroesophageal reflux disease) 12/25/2014  . Menstrual migraine   . Preventative health care 08/09/2014    Past Surgical History:  Procedure Laterality Date  . TUBAL LIGATION  2004    Prior to Admission medications   Medication Sig Start Date End Date Taking? Authorizing Provider  acetaminophen (TYLENOL) 500 MG tablet Take 1,000 mg by mouth every 6 (six) hours as needed for mild pain or moderate pain.    Yes Historical Provider, MD  rizatriptan (MAXALT) 5 MG  tablet Take 1 tablet (5 mg total) by mouth as needed for migraine. May repeat in 2 hours if needed 07/26/15  Yes Ria Bush, MD    Allergies Review of patient's allergies indicates no known allergies.  Family History  Problem Relation Age of Onset  . Hypertension Mother   . Arthritis Mother   . Cancer Maternal Grandmother 81    GYN (ovary/uterus)  . Cancer Maternal Aunt     stomach  . Diabetes Neg Hx   . CAD Neg Hx   . Stroke Neg Hx     Social History Social History  Substance Use Topics  . Smoking status: Never Smoker  . Smokeless tobacco: Never Used  . Alcohol use No    Review of Systems Patient denies headaches, rhinorrhea, blurry vision, numbness, shortness of breath, chest pain, edema, cough, abdominal pain, nausea, vomiting, diarrhea, dysuria, fevers, rashes or hallucinations unless otherwise stated above in HPI. ____________________________________________   PHYSICAL EXAM:  VITAL SIGNS: Vitals:   10/05/15 1541  BP: (!) 136/43  Pulse: 72  Resp: 18  Temp: 98 F (36.7 C)    Constitutional: Alert and oriented. Well appearing and in no acute distress. Eyes: Conjunctivae are normal. PERRL. EOMI. Head: Atraumatic. Nose: No congestion/rhinnorhea. Mouth/Throat: Mucous membranes are moist.  Oropharynx non-erythematous. Neck: No stridor. Painless ROM. No cervical spine tenderness to palpation Hematological/Lymphatic/Immunilogical: No cervical lymphadenopathy. Cardiovascular: Normal rate, regular rhythm. Grossly normal heart sounds.  Good peripheral circulation. Respiratory:  Normal respiratory effort.  No retractions. Lungs CTAB. Gastrointestinal: Soft and nontender. No distention. No abdominal bruits. No CVA tenderness. Genitourinary:  Musculoskeletal: No lower extremity tenderness nor edema.  No joint effusions. Neurologic:  Normal speech and language. No gross focal neurologic deficits are appreciated. No gait instability. Skin:  Skin is warm, dry and  intact. No rash noted. Psychiatric: Mood and affect are normal. Speech and behavior are normal.  ____________________________________________   LABS (all labs ordered are listed, but only abnormal results are displayed)  Results for orders placed or performed during the hospital encounter of 10/05/15 (from the past 24 hour(s))  CBC with Differential     Status: None   Collection Time: 10/05/15  4:01 PM  Result Value Ref Range   WBC 7.8 3.6 - 11.0 K/uL   RBC 4.33 3.80 - 5.20 MIL/uL   Hemoglobin 13.4 12.0 - 16.0 g/dL   HCT 39.8 35.0 - 47.0 %   MCV 91.9 80.0 - 100.0 fL   MCH 31.0 26.0 - 34.0 pg   MCHC 33.8 32.0 - 36.0 g/dL   RDW 13.2 11.5 - 14.5 %   Platelets 258 150 - 440 K/uL   Neutrophils Relative % 64 %   Neutro Abs 5.0 1.4 - 6.5 K/uL   Lymphocytes Relative 29 %   Lymphs Abs 2.2 1.0 - 3.6 K/uL   Monocytes Relative 6 %   Monocytes Absolute 0.5 0.2 - 0.9 K/uL   Eosinophils Relative 1 %   Eosinophils Absolute 0.1 0 - 0.7 K/uL   Basophils Relative 0 %   Basophils Absolute 0.0 0 - 0.1 K/uL  Comprehensive metabolic panel     Status: Abnormal   Collection Time: 10/05/15  4:01 PM  Result Value Ref Range   Sodium 137 135 - 145 mmol/L   Potassium 4.0 3.5 - 5.1 mmol/L   Chloride 103 101 - 111 mmol/L   CO2 29 22 - 32 mmol/L   Glucose, Bld 93 65 - 99 mg/dL   BUN 17 6 - 20 mg/dL   Creatinine, Ser 0.88 0.44 - 1.00 mg/dL   Calcium 9.0 8.9 - 10.3 mg/dL   Total Protein 7.4 6.5 - 8.1 g/dL   Albumin 4.2 3.5 - 5.0 g/dL   AST 22 15 - 41 U/L   ALT 11 (L) 14 - 54 U/L   Alkaline Phosphatase 53 38 - 126 U/L   Total Bilirubin 0.5 0.3 - 1.2 mg/dL   GFR calc non Af Amer >60 >60 mL/min   GFR calc Af Amer >60 >60 mL/min   Anion gap 5 5 - 15  Troponin I     Status: None   Collection Time: 10/05/15  4:01 PM  Result Value Ref Range   Troponin I <0.03 <0.03 ng/mL   ____________________________________________  EKG My review and personal interpretation at Time: 15:38   Indication: chest  pain Rate: 75  Rhythm: sinus Axis: normal Other: twave inversion in inferolateral distribution ____________________________________________  RADIOLOGY  CXR with hyperinflated lungs no focal pna ____________________________________________   PROCEDURES  Procedure(s) performed: none    Critical Care performed: no ____________________________________________   INITIAL IMPRESSION / ASSESSMENT AND PLAN / ED COURSE  Pertinent labs & imaging results that were available during my care of the patient were reviewed by me and considered in my medical decision making (see chart for details).  DDX: ACS, pericarditis, esophagitis, boerhaaves, pe, dissection, pna, bronchitis, costochondritis   Denise Gordon is a 45 y.o. who presents to the ED with  2 months of chest pain is worse at night. She arrives afebrile and hemodynamically stable. Initial EKG does show abnormal T-wave inversions in the inferolateral distribution but no ST elevations or depressions. Patient denies any change in symptoms over the past 2 months. Is not clinically consistent with pericarditis. Does have some family history of asthma which could predispose her to acute bronchitis or asthma exacerbation. She's low risk Wells and perc negative.  Her abdominal exam is soft and benign. Denies any retching to suggest an esophagitis or Boerhaave's. She does have pain which is reproducible with palpation of the anterior chest wall suggesting that she may have some component of costochondritis. Her risk factors for ACS R hyperlipidemia and diabetes but denies any family history of early cardiac disease. Does seem to be very atypical for ACS or angina for her to have 2 months of persistent symptoms.  The patient will be placed on continuous pulse oximetry and telemetry for monitoring.  Laboratory evaluation will be sent to evaluate for the above complaints.     Clinical Course  Comment By Time  Chest x-ray is suggestive of hyperinflation.  There is evidence of possible pulmonary nodule that I do not feel that at this time CT imaging is clinically indicated Merlyn Lot, MD 09/22 1808  Patient reassessed. Patient with 1 out of 10 chest pain. Repeat EKG shows no acute EKG changes. Patient was given nitroglycerin and did not have any improvement with symptoms. Denies some discomfort with naproxen and fentanyl. T-wave inversions are persistent. Repeat troponin is negative. As the patient is still having some discomfort, I discussed with the patient and her visitor my recommendations for admission to hospital for serial EKG, telemetry monitoring and stress testing in the morning. Patient demonstrates understanding of the risks associated with being discharged prior to complete workup but is requesting discharge for outpatient follow-up and outpatient stress testing. Had extensive discussion patient with the patient regarding signs and symptoms for which she should immediately return to the ER. As these symptoms have been ongoing and unchanged for 2 months do not think this is an unreasonable approach.  Have discussed with the patient and available family all diagnostics and treatments performed thus far and all questions were answered to the best of my ability. The patient demonstrates understanding and agreement with plan.  Merlyn Lot, MD 09/22 2021     ____________________________________________   FINAL CLINICAL IMPRESSION(S) / ED DIAGNOSES  Final diagnoses:  Chest pain, unspecified chest pain type      NEW MEDICATIONS STARTED DURING THIS VISIT:  New Prescriptions   No medications on file     Note:  This document was prepared using Dragon voice recognition software and may include unintentional dictation errors.    Merlyn Lot, MD 10/05/15 2157

## 2015-10-05 NOTE — Telephone Encounter (Signed)
Per chart review pt is at [facility] ED. 

## 2015-10-05 NOTE — ED Notes (Signed)
Patient states that she has been having mid-chest pain for the past 2 months. Patient has also had bilateral lower leg pain. Patient states that she called her MD today to setup an appointment with them and they referred her to the ER for evaluation. Patient does not appear to be in any distress at this time.

## 2015-10-05 NOTE — ED Notes (Signed)
Reviewed d/c instructions, follow-up care and prescriptions with pt. Pt verbalized understanding 

## 2015-10-05 NOTE — ED Triage Notes (Signed)
Reports chest pain and bilat leg pain x 2 months.  Skin w/d with good color.

## 2015-10-05 NOTE — ED Notes (Signed)
Patient given Fentanyl for pain. Patient is aware she will be to have a driver when/if discharged.

## 2015-10-07 ENCOUNTER — Emergency Department: Payer: No Typology Code available for payment source

## 2015-10-07 ENCOUNTER — Emergency Department
Admission: EM | Admit: 2015-10-07 | Discharge: 2015-10-07 | Disposition: A | Discharge status: Home / Self Care | Payer: No Typology Code available for payment source | Attending: Emergency Medicine | Admitting: Emergency Medicine

## 2015-10-07 DIAGNOSIS — R079 Chest pain, unspecified: Secondary | ICD-10-CM | POA: Diagnosis not present

## 2015-10-07 LAB — CBC
HCT: 40 % (ref 35.0–47.0)
HEMOGLOBIN: 13.4 g/dL (ref 12.0–16.0)
MCH: 30.9 pg (ref 26.0–34.0)
MCHC: 33.4 g/dL (ref 32.0–36.0)
MCV: 92.5 fL (ref 80.0–100.0)
PLATELETS: 275 10*3/uL (ref 150–440)
RBC: 4.33 MIL/uL (ref 3.80–5.20)
RDW: 13.1 % (ref 11.5–14.5)
WBC: 12.2 10*3/uL — AB (ref 3.6–11.0)

## 2015-10-07 LAB — BASIC METABOLIC PANEL
ANION GAP: 6 (ref 5–15)
BUN: 22 mg/dL — ABNORMAL HIGH (ref 6–20)
CALCIUM: 9.4 mg/dL (ref 8.9–10.3)
CO2: 26 mmol/L (ref 22–32)
CREATININE: 0.99 mg/dL (ref 0.44–1.00)
Chloride: 106 mmol/L (ref 101–111)
Glucose, Bld: 139 mg/dL — ABNORMAL HIGH (ref 65–99)
Potassium: 4.4 mmol/L (ref 3.5–5.1)
SODIUM: 138 mmol/L (ref 135–145)

## 2015-10-07 LAB — TROPONIN I

## 2015-10-07 MED ORDER — DIAZEPAM 5 MG PO TABS: 5.0000 mg | ORAL_TABLET | Freq: Three times a day (TID) | ORAL | 0 refills | Status: DC | PRN

## 2015-10-07 MED ORDER — DIAZEPAM 5 MG PO TABS
5.0000 mg | ORAL_TABLET | Freq: Once | ORAL | Status: AC
  Administered 2015-10-07: 5 mg via ORAL

## 2015-10-07 MED ORDER — DIAZEPAM 5 MG PO TABS
ORAL_TABLET | ORAL | Status: AC
  Administered 2015-10-07: 5 mg via ORAL
  Filled 2015-10-07: qty 1

## 2015-10-07 NOTE — ED Triage Notes (Signed)
Patient reports mid sternal chest pain that is non radiating.  Reports seen Friday for same symptoms and given medication for possible asthma.

## 2015-10-07 NOTE — ED Provider Notes (Signed)
[facility] Emergency Department Provider Note        Time seen: ----------------------------------------- 10:35 PM on 10/07/2015 -----------------------------------------    I have reviewed the triage vital signs and the nursing notes.   HISTORY  Chief Complaint Chest Pain    HPI Denise Gordon is a 45 y.o. female who presents to ER for midsternal chest pain is nonradiating. Patient was seen Friday for the same symptoms and given medication for possible asthma. Patient states she was better for a day and then her symptoms came back. She's had chronic leg pain of uncertain etiology. She does moderate lifting on her job and does walk a lot on her job. She denies any cardiac risk factors   Past Medical History:  Diagnosis Date  . Chicken pox   . Dysmenorrhea   . GERD (gastroesophageal reflux disease) 12/25/2014  . Menorrhagia with regular cycle   . Menstrual migraine     Patient Active Problem List   Diagnosis Date Noted  . Perimenopausal vasomotor symptoms 07/26/2015  . Right thyroid nodule 07/26/2015  . Fibroids 01/11/2015  . Toe pain, left 12/25/2014  . GERD (gastroesophageal reflux disease) 12/25/2014  . Menstrual migraine   . Preventative health care 08/09/2014    Past Surgical History:  Procedure Laterality Date  . TUBAL LIGATION  2004    Allergies Review of patient's allergies indicates no known allergies.  Social History Social History  Substance Use Topics  . Smoking status: Never Smoker  . Smokeless tobacco: Never Used  . Alcohol use No    Review of Systems Constitutional: Negative for fever. Cardiovascular: Positive for chest pain Respiratory: Negative for shortness of breath. Gastrointestinal: Negative for abdominal pain, vomiting and diarrhea. Genitourinary: Negative for dysuria. Musculoskeletal: Positive for bilateral leg pain Skin: Negative for rash. Neurological: Negative for headaches, focal weakness or  numbness.  10-point ROS otherwise negative.  ____________________________________________   PHYSICAL EXAM:  VITAL SIGNS: ED Triage Vitals  Enc Vitals Group     BP 10/07/15 2020 (!) 155/72     Pulse Rate 10/07/15 2020 (!) 58     Resp 10/07/15 2020 18     Temp 10/07/15 2020 98.3 F (36.8 C)     Temp Source 10/07/15 2020 Oral     SpO2 10/07/15 2020 99 %     Weight --      Height --      Head Circumference --      Peak Flow --      Pain Score 10/07/15 2021 4     Pain Loc --      Pain Edu? --      Excl. in [place]? --     Constitutional: Alert and oriented. Well appearing and in no distress. Eyes: Conjunctivae are normal. PERRL. Normal extraocular movements. ENT   Head: Normocephalic and atraumatic.   Nose: No congestion/rhinnorhea.   Mouth/Throat: Mucous membranes are moist.   Neck: No stridor. Cardiovascular: Normal rate, regular rhythm. No murmurs, rubs, or gallops. Respiratory: Normal respiratory effort without tachypnea nor retractions. Breath sounds are clear and equal bilaterally. No wheezes/rales/rhonchi. Gastrointestinal: Soft and nontender. Normal bowel sounds Musculoskeletal: Nontender with normal range of motion in all extremities. No lower extremity tenderness nor edema. Neurologic:  Normal speech and language. No gross focal neurologic deficits are appreciated.  Skin:  Skin is warm, dry and intact. No rash noted. Psychiatric: Mood and affect are normal. Speech and behavior are normal.  ____________________________________________  EKG: Interpreted by me. Sinus rhythm with  a rate of 65 bpm, normal PR interval, normal QRS size, normal QT, T wave inversions.  ____________________________________________  ED COURSE: Pertinent labs & imaging results that were available during my care of the patient were reviewed by me and considered in my medical decision making (see chart for details). Clinical Course   Procedures ____________________________________________   LABS (pertinent positives/negatives)  Labs Reviewed  BASIC METABOLIC PANEL - Abnormal; Notable for the following:       Result Value   Glucose, Bld 139 (*)    BUN 22 (*)    All other components within normal limits  CBC - Abnormal; Notable for the following:    WBC 12.2 (*)    All other components within normal limits  TROPONIN I    RADIOLOGY Images were viewed by Chest x-rays unremarkable   ____________________________________________  FINAL ASSESSMENT AND PLAN:   Chest pain   Patient with labs and imaging as dictated above. Patient's no acute distress, suspect musculoskeletal and/or anxiety origin. She'll be given Valium and encouraged to have close follow-up for reevaluation.   Earleen Newport, MD   Note: This dictation was prepared with Dragon dictation. Any transcriptional errors that result from this process are unintentional    Earleen Newport, MD 10/07/15 2239

## 2015-10-09 DIAGNOSIS — E782 Mixed hyperlipidemia: Secondary | ICD-10-CM | POA: Insufficient documentation

## 2015-10-19 ENCOUNTER — Ambulatory Visit (INDEPENDENT_AMBULATORY_CARE_PROVIDER_SITE_OTHER)
Admission: RE | Admit: 2015-10-19 | Discharge: 2015-10-19 | Disposition: A | Discharge status: Home / Self Care | Payer: No Typology Code available for payment source | Source: Ambulatory Visit | Attending: Family Medicine | Admitting: Family Medicine

## 2015-10-19 ENCOUNTER — Ambulatory Visit (INDEPENDENT_AMBULATORY_CARE_PROVIDER_SITE_OTHER): Payer: No Typology Code available for payment source | Admitting: Family Medicine

## 2015-10-19 ENCOUNTER — Encounter: Payer: Self-pay | Admitting: Family Medicine

## 2015-10-19 VITALS — BP 128/82 | HR 84 | Temp 98.4°F | Wt 123.5 lb

## 2015-10-19 DIAGNOSIS — R109 Unspecified abdominal pain: Secondary | ICD-10-CM

## 2015-10-19 DIAGNOSIS — R9431 Abnormal electrocardiogram [ECG] [EKG]: Secondary | ICD-10-CM

## 2015-10-19 DIAGNOSIS — R938 Abnormal findings on diagnostic imaging of other specified body structures: Secondary | ICD-10-CM

## 2015-10-19 DIAGNOSIS — R0789 Other chest pain: Secondary | ICD-10-CM

## 2015-10-19 DIAGNOSIS — M546 Pain in thoracic spine: Secondary | ICD-10-CM

## 2015-10-19 DIAGNOSIS — R9389 Abnormal findings on diagnostic imaging of other specified body structures: Secondary | ICD-10-CM

## 2015-10-19 LAB — CBC WITH DIFFERENTIAL/PLATELET
BASOS ABS: 0 10*3/uL (ref 0.0–0.1)
BASOS PCT: 0.5 % (ref 0.0–3.0)
EOS ABS: 0.2 10*3/uL (ref 0.0–0.7)
Eosinophils Relative: 3.7 % (ref 0.0–5.0)
HCT: 37.6 % (ref 36.0–46.0)
HEMOGLOBIN: 12.7 g/dL (ref 12.0–15.0)
Lymphocytes Relative: 28.1 % (ref 12.0–46.0)
Lymphs Abs: 1.7 10*3/uL (ref 0.7–4.0)
MCHC: 33.7 g/dL (ref 30.0–36.0)
MCV: 91.3 fl (ref 78.0–100.0)
MONO ABS: 0.4 10*3/uL (ref 0.1–1.0)
Monocytes Relative: 7.2 % (ref 3.0–12.0)
Neutro Abs: 3.6 10*3/uL (ref 1.4–7.7)
Neutrophils Relative %: 60.5 % (ref 43.0–77.0)
PLATELETS: 261 10*3/uL (ref 150.0–400.0)
RBC: 4.12 Mil/uL (ref 3.87–5.11)
RDW: 13.5 % (ref 11.5–15.5)
WBC: 6 10*3/uL (ref 4.0–10.5)

## 2015-10-19 LAB — COMPREHENSIVE METABOLIC PANEL
ALK PHOS: 54 U/L (ref 39–117)
ALT: 11 U/L (ref 0–35)
AST: 18 U/L (ref 0–37)
Albumin: 3.8 g/dL (ref 3.5–5.2)
BILIRUBIN TOTAL: 0.4 mg/dL (ref 0.2–1.2)
BUN: 15 mg/dL (ref 6–23)
CO2: 30 meq/L (ref 19–32)
CREATININE: 0.8 mg/dL (ref 0.40–1.20)
Calcium: 9.1 mg/dL (ref 8.4–10.5)
Chloride: 104 mEq/L (ref 96–112)
GFR: 81.81 mL/min (ref >60.00)
GLUCOSE: 77 mg/dL (ref 70–99)
Potassium: 4 mEq/L (ref 3.5–5.1)
Sodium: 138 mEq/L (ref 135–145)
TOTAL PROTEIN: 7.1 g/dL (ref 6.0–8.3)

## 2015-10-19 LAB — LIPASE: Lipase: 43 U/L (ref 11.0–59.0)

## 2015-10-19 NOTE — Progress Notes (Signed)
BP 128/82   Pulse 84   Temp 98.4 F (36.9 C) (Oral)   Wt 123 lb 8 oz (56 kg)   LMP 09/19/2015   BMI 22.59 kg/m    CC: ER f/u visit Subjective:    Patient ID: Denise Gordon, female    DOB: [birth year], 45 y.o.   MRN: [id number]  HPI: Denise Gordon is a 45 y.o. female presenting on 10/19/2015 for Chest Pain (has been to ER and cards)   Here with SO Louie Casa.  Recent ER visits x 2 with chest pain with stable CXR (initial one with irregular density LUL) and normal troponins, referred to Dr Nehemiah Massed or abnormal EKG with indeterminate stress test (indeterminate due to abnl baseline EKG but chest pain at peak exercise). Started on imdur and echo was obtained - normal systolic function with possible diastolic dysfunction (grade 2), EF 55%, no significant valvular disease. Overall unrevealing evaluation. Available records reviewed.   She has been on imdur 30mg  daily for the past week without significant improvement. NSAIDs didn't help. Nitro didn't help. Valium didn't help. Prednisone course didn't help.   Ongoing chest pain with fatigue. Chest discomfort described as burning pain, pressure, associated with dyspnea and diaphoresis. Initial chest pain was substernal, now today endorses radiation to the back. Currently chest pain free. Denies fevers/chills, nausea/vomiting, diarrhea/constipation, denies significant GERD sxs. Not food related. Endorses some crescendo/decrescendo type discomfort. However states she is able to eat greasy food without reproduction. Appetite remains great.   Brings FMLA forms to be filled out. 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 Would like seated restrictions. Has continued working throughout chest pain episodes.   LMP 09/18/2015. S/p BTL  Relevant past medical, surgical, family and social history reviewed and updated as indicated. Interim medical history since our last visit reviewed. Allergies and  medications reviewed and updated. Current Outpatient Prescriptions on File Prior to Visit  Medication Sig  . acetaminophen (TYLENOL) 500 MG tablet Take 1,000 mg by mouth every 6 (six) hours as needed for mild pain or moderate pain.   Marland Kitchen albuterol (PROVENTIL HFA;VENTOLIN HFA) 108 (90 Base) MCG/ACT inhaler Inhale 2 puffs into the lungs every 6 (six) hours as needed for wheezing or shortness of breath.  . diazepam (VALIUM) 5 MG tablet Take 1 tablet (5 mg total) by mouth every 8 (eight) hours as needed for muscle spasms.  . rizatriptan (MAXALT) 5 MG tablet Take 1 tablet (5 mg total) by mouth as needed for migraine. May repeat in 2 hours if needed   No current facility-administered medications on file prior to visit.     Review of Systems Per HPI unless specifically indicated in ROS section     Objective:    BP 128/82   Pulse 84   Temp 98.4 F (36.9 C) (Oral)   Wt 123 lb 8 oz (56 kg)   LMP 09/19/2015   BMI 22.59 kg/m   Wt Readings from Last 3 Encounters:  10/19/15 123 lb 8 oz (56 kg)  10/05/15 123 lb (55.8 kg)  07/26/15 121 lb 12 oz (55.2 kg)    Physical Exam  Constitutional: She appears well-developed and well-nourished. No distress.  HENT:  Mouth/Throat: Oropharynx is clear and moist. No oropharyngeal exudate.  Cardiovascular: Normal rate, regular rhythm, normal heart sounds and intact distal pulses.   No murmur heard. Pulmonary/Chest: Effort normal and breath sounds normal. No respiratory distress. She has no wheezes. She has no rales.  She exhibits tenderness. She exhibits no mass.    Reproducible tenderness to palpation mid sternum as well as entire R>L costochondral junction. Tender at xiphoid process  Abdominal: Soft. Normal appearance and bowel sounds are normal. She exhibits no distension and no mass. There is no hepatosplenomegaly. There is tenderness. There is no rigidity, no rebound, no guarding, no CVA tenderness and negative Murphy's sign.  Mild discomfort to palpation  at RUQ  Musculoskeletal: She exhibits no edema.  Midline upper thoracic spine tenderness without paraspinous mm tenderness/tightness  Skin: Skin is warm and dry. No rash noted.  Psychiatric: She has a normal mood and affect. Her behavior is normal. Judgment and thought content normal.  Nursing note and vitals reviewed.  Results for orders placed or performed in visit on 10/19/15  Comprehensive metabolic panel  Result Value Ref Range   Sodium 138 135 - 145 mEq/L   Potassium 4.0 3.5 - 5.1 mEq/L   Chloride 104 96 - 112 mEq/L   CO2 30 19 - 32 mEq/L   Glucose, Bld 77 70 - 99 mg/dL   BUN 15 6 - 23 mg/dL   Creatinine, Ser 0.80 0.40 - 1.20 mg/dL   Total Bilirubin 0.4 0.2 - 1.2 mg/dL   Alkaline Phosphatase 54 39 - 117 U/L   AST 18 0 - 37 U/L   ALT 11 0 - 35 U/L   Total Protein 7.1 6.0 - 8.3 g/dL   Albumin 3.8 3.5 - 5.2 g/dL   Calcium 9.1 8.4 - 10.5 mg/dL   GFR 81.81 >60.00 mL/min  Lipase  Result Value Ref Range   Lipase 43.0 11.0 - 59.0 U/L  CBC with Differential/Platelet  Result Value Ref Range   WBC 6.0 4.0 - 10.5 K/uL   RBC 4.12 3.87 - 5.11 Mil/uL   Hemoglobin 12.7 12.0 - 15.0 g/dL   HCT 37.6 36.0 - 46.0 %   MCV 91.3 78.0 - 100.0 fl   MCHC 33.7 30.0 - 36.0 g/dL   RDW 13.5 11.5 - 15.5 %   Platelets 261.0 150.0 - 400.0 K/uL   Neutrophils Relative % 60.5 43.0 - 77.0 %   Lymphocytes Relative 28.1 12.0 - 46.0 %   Monocytes Relative 7.2 3.0 - 12.0 %   Eosinophils Relative 3.7 0.0 - 5.0 %   Basophils Relative 0.5 0.0 - 3.0 %   Neutro Abs 3.6 1.4 - 7.7 K/uL   Lymphs Abs 1.7 0.7 - 4.0 K/uL   Monocytes Absolute 0.4 0.1 - 1.0 K/uL   Eosinophils Absolute 0.2 0.0 - 0.7 K/uL   Basophils Absolute 0.0 0.0 - 0.1 K/uL      Assessment & Plan:  Over 45 minutes were spent face-to-face with the patient during this encounter and >50% of that time was spent on counseling and coordination of care  Problem List Items Addressed This Visit    Abnormal CXR    Possible LUL lung nodule (0.7cm)  suggest CT to confirm - will need to discuss at f/u with patient.      Chest wall pain - Primary    Ongoing reproducible chest burning/pressure/soreness unrelated to food, some crescendo/decrescendo description, occasional boring pain to back. Cardiac workup unrevealing, in setting of abnormal EKG ?pt's baseline. Chest pain is reproducible to palpation today pointing to musculoskeletal cause.  Anticipate costochondritis/sternalis syndrome however no improvement with NSAIDs or steroids.  Ddx includes gallstones, pancreatitis, thoracic DDD or other posterior chest wall pain syndrome, anxiety.  Check labs today (LFTs, lipase, CBC) and abd [place] and  thoracic spine films.  Will fill out FMLA forms with work restrictions.  Will contact pt with results and plan. Consider gabapentin for possible thoracic neuralgia component.       Nonspecific abnormal electrocardiogram (ECG) (EKG)    EKG - NSR rate 75, normal axis, short PR, diffuse ST depression with T inversion inferior and anterolateral leads. Associated with chest pains. Has seen cardiology. No improvement with imdur. Echocardiogram was reassuringly normal.       Thoracic spine pain    New midline thoracic pain reproducible with palpation - ?thoracic nerve compression with referred pain to chest. Check thoracic spine films today.       Relevant Orders   DG Thoracic Spine W/Swimmers (Completed)    Other Visit Diagnoses    Abdominal pain, unspecified abdominal location       Relevant Orders   Comprehensive metabolic panel (Completed)   Lipase (Completed)   US Abdomen Complete   CBC with Differential/Platelet (Completed)       Follow up plan: Return if symptoms worsen or fail to improve.  Ria Bush, MD

## 2015-10-19 NOTE — Progress Notes (Signed)
Pre visit review using our clinic review tool, if applicable. No additional management support is needed unless otherwise documented below in the visit note. 

## 2015-10-19 NOTE — Patient Instructions (Addendum)
laboratorios hoy. rayos x hoy.  sonograma de abdomen hoy.  La contactaremos con [employer].  Posible costocondritis, posible calculo en la vesicula biliar Yo rellenare FMLA forms.

## 2015-10-20 ENCOUNTER — Encounter: Payer: Self-pay | Admitting: Family Medicine

## 2015-10-20 DIAGNOSIS — M546 Pain in thoracic spine: Secondary | ICD-10-CM | POA: Insufficient documentation

## 2015-10-20 DIAGNOSIS — R9431 Abnormal electrocardiogram [ECG] [EKG]: Secondary | ICD-10-CM | POA: Insufficient documentation

## 2015-10-20 DIAGNOSIS — R9389 Abnormal findings on diagnostic imaging of other specified body structures: Secondary | ICD-10-CM | POA: Insufficient documentation

## 2015-10-20 DIAGNOSIS — R0789 Other chest pain: Secondary | ICD-10-CM | POA: Insufficient documentation

## 2015-10-20 NOTE — Assessment & Plan Note (Signed)
Possible LUL lung nodule (0.7cm) suggest CT to confirm - will need to discuss at f/u with patient.

## 2015-10-20 NOTE — Assessment & Plan Note (Addendum)
Ongoing reproducible chest burning/pressure/soreness unrelated to food, some crescendo/decrescendo description, occasional boring pain to back. Cardiac workup unrevealing, in setting of abnormal EKG ?pt's baseline. Chest pain is reproducible to palpation today pointing to musculoskeletal cause.  Anticipate costochondritis/sternalis syndrome however no improvement with NSAIDs or steroids.  Ddx includes gallstones, pancreatitis, thoracic DDD or other posterior chest wall pain syndrome, anxiety.  Check labs today (LFTs, lipase, CBC) and abd [place] and thoracic spine films.  Will fill out FMLA forms with work restrictions.  Will contact pt with results and plan. Consider gabapentin for possible thoracic neuralgia component.

## 2015-10-20 NOTE — Assessment & Plan Note (Addendum)
EKG - NSR rate 75, normal axis, short PR, diffuse ST depression with T inversion inferior and anterolateral leads. Associated with chest pains. Has seen cardiology. No improvement with imdur. Echocardiogram was reassuringly normal.

## 2015-10-20 NOTE — Assessment & Plan Note (Signed)
New midline thoracic pain reproducible with palpation - ?thoracic nerve compression with referred pain to chest. Check thoracic spine films today.

## 2015-10-22 ENCOUNTER — Telehealth: Payer: Self-pay | Admitting: Family Medicine

## 2015-10-22 NOTE — Telephone Encounter (Addendum)
Forms given to patient's SO.

## 2015-10-22 NOTE — Telephone Encounter (Signed)
FMLA forms in Kim's box.

## 2015-10-24 ENCOUNTER — Telehealth: Payer: Self-pay | Admitting: Family Medicine

## 2015-10-24 DIAGNOSIS — R9389 Abnormal findings on diagnostic imaging of other specified body structures: Secondary | ICD-10-CM

## 2015-10-24 DIAGNOSIS — R0789 Other chest pain: Secondary | ICD-10-CM

## 2015-10-24 DIAGNOSIS — M546 Pain in thoracic spine: Secondary | ICD-10-CM

## 2015-10-24 NOTE — Telephone Encounter (Signed)
Louie Casa called about patient's CT appointment.  He said he hasn't heard anything yet about scheduling the appointment.  Please call Louie Casa.

## 2015-10-24 NOTE — Telephone Encounter (Signed)
I didn't see where this has been ordered yet.

## 2015-10-25 NOTE — Telephone Encounter (Signed)
ordered

## 2015-10-26 ENCOUNTER — Ambulatory Visit
Admission: RE | Admit: 2015-10-26 | Discharge: 2015-10-26 | Disposition: A | Discharge status: Home / Self Care | Payer: No Typology Code available for payment source | Source: Ambulatory Visit | Attending: Family Medicine | Admitting: Family Medicine

## 2015-10-26 DIAGNOSIS — R109 Unspecified abdominal pain: Secondary | ICD-10-CM

## 2015-10-26 NOTE — Telephone Encounter (Signed)
Filled and in Kim's box. 

## 2015-10-26 NOTE — Telephone Encounter (Signed)
Message left advising paperwork was ready for pick up and placed up front.

## 2015-10-26 NOTE — Telephone Encounter (Signed)
Additional info required for FMLA forms. In your IN box.

## 2015-10-29 ENCOUNTER — Ambulatory Visit: Payer: No Typology Code available for payment source

## 2015-10-31 ENCOUNTER — Ambulatory Visit
Admission: RE | Admit: 2015-10-31 | Discharge: 2015-10-31 | Disposition: A | Discharge status: Home / Self Care | Payer: No Typology Code available for payment source | Source: Ambulatory Visit | Attending: Family Medicine | Admitting: Family Medicine

## 2015-10-31 DIAGNOSIS — R0789 Other chest pain: Secondary | ICD-10-CM | POA: Diagnosis present

## 2015-10-31 DIAGNOSIS — M546 Pain in thoracic spine: Secondary | ICD-10-CM | POA: Diagnosis present

## 2015-10-31 DIAGNOSIS — R938 Abnormal findings on diagnostic imaging of other specified body structures: Secondary | ICD-10-CM | POA: Insufficient documentation

## 2015-10-31 DIAGNOSIS — R9389 Abnormal findings on diagnostic imaging of other specified body structures: Secondary | ICD-10-CM

## 2015-10-31 MED ORDER — IOPAMIDOL (ISOVUE-300) INJECTION 61%
75.0000 mL | Freq: Once | INTRAVENOUS | Status: AC | PRN
  Administered 2015-10-31: 75 mL via INTRAVENOUS

## 2015-11-01 ENCOUNTER — Telehealth: Payer: Self-pay | Admitting: *Deleted

## 2015-11-01 NOTE — Telephone Encounter (Signed)
Patient and friend notified of CT results. Follow up scheduled due to still having pain.

## 2015-11-05 ENCOUNTER — Ambulatory Visit (INDEPENDENT_AMBULATORY_CARE_PROVIDER_SITE_OTHER): Payer: No Typology Code available for payment source | Admitting: Family Medicine

## 2015-11-05 ENCOUNTER — Encounter: Payer: Self-pay | Admitting: Family Medicine

## 2015-11-05 ENCOUNTER — Encounter: Payer: Self-pay | Admitting: *Deleted

## 2015-11-05 VITALS — BP 122/76 | HR 76 | Temp 97.9°F | Wt 126.5 lb

## 2015-11-05 DIAGNOSIS — M546 Pain in thoracic spine: Secondary | ICD-10-CM

## 2015-11-05 DIAGNOSIS — E041 Nontoxic single thyroid nodule: Secondary | ICD-10-CM | POA: Diagnosis not present

## 2015-11-05 DIAGNOSIS — R067 Sneezing: Secondary | ICD-10-CM

## 2015-11-05 DIAGNOSIS — R0789 Other chest pain: Secondary | ICD-10-CM | POA: Diagnosis not present

## 2015-11-05 DIAGNOSIS — R5383 Other fatigue: Secondary | ICD-10-CM | POA: Diagnosis not present

## 2015-11-05 DIAGNOSIS — R9389 Abnormal findings on diagnostic imaging of other specified body structures: Secondary | ICD-10-CM

## 2015-11-05 DIAGNOSIS — R0600 Dyspnea, unspecified: Secondary | ICD-10-CM

## 2015-11-05 DIAGNOSIS — R938 Abnormal findings on diagnostic imaging of other specified body structures: Secondary | ICD-10-CM

## 2015-11-05 DIAGNOSIS — R9431 Abnormal electrocardiogram [ECG] [EKG]: Secondary | ICD-10-CM

## 2015-11-05 LAB — BASIC METABOLIC PANEL
BUN: 15 mg/dL (ref 6–23)
CO2: 26 meq/L (ref 19–32)
Calcium: 9.1 mg/dL (ref 8.4–10.5)
Chloride: 105 mEq/L (ref 96–112)
Creatinine, Ser: 0.74 mg/dL (ref 0.40–1.20)
GFR: 89.5 mL/min (ref >60.00)
GLUCOSE: 88 mg/dL (ref 70–99)
POTASSIUM: 3.9 meq/L (ref 3.5–5.1)
SODIUM: 137 meq/L (ref 135–145)

## 2015-11-05 LAB — TSH: TSH: 1.41 u[IU]/mL (ref 0.35–4.50)

## 2015-11-05 LAB — T4, FREE: FREE T4: 0.54 ng/dL — AB (ref 0.60–1.60)

## 2015-11-05 LAB — FERRITIN: Ferritin: 16.1 ng/mL (ref 10.0–291.0)

## 2015-11-05 LAB — VITAMIN D 25 HYDROXY (VIT D DEFICIENCY, FRACTURES): VITD: 21.99 ng/mL — AB (ref 30.00–100.00)

## 2015-11-05 LAB — VITAMIN B12: VITAMIN B 12: 241 pg/mL (ref 211–911)

## 2015-11-05 LAB — SEDIMENTATION RATE: SED RATE: 29 mm/h — AB (ref 0–20)

## 2015-11-05 LAB — FOLATE: FOLATE: 22 ng/mL (ref >5.9)

## 2015-11-05 NOTE — Patient Instructions (Addendum)
Laboratorios hoy La referiremos a [employer] para evaluacion.  Siga valium para dolor.  Regrese para cita con Dr Nehemiah Massed. Look into posterior chest wall pain syndrome - possible cause of your symptoms. Do gentle stretching, consider physical therapy referral. Aviseme como sigue en las proximas semanas.

## 2015-11-05 NOTE — Progress Notes (Signed)
Pre visit review using our clinic review tool, if applicable. No additional management support is needed unless otherwise documented below in the visit note. 

## 2015-11-05 NOTE — Progress Notes (Signed)
BP 122/76   Pulse 76   Temp 97.9 F (36.6 C) (Oral)   Wt 126 lb 8 oz (57.4 kg)   LMP 09/19/2015 (Exact Date)   SpO2 99% Comment: RA  BMI 23.14 kg/m    CC: f/u pain Subjective:    Patient ID: Denise Gordon, female    DOB: Jul 12, 1970, 45 y.o.   MRN: [id number]  HPI: Denise Gordon is a 45 y.o. female presenting on 11/05/2015 for Follow-up (Recheck-still having pain)   See prior note for details. Ongoing chest pain with fatigue despite overall unrevealing evaluation - CXR, troponins, cardiac evaluation Nehemiah Massed), abd [place], thoracic films and CT chest. No improvement with imdur 30mg  daily. She has only tolerated 1/2 tablet at a time due to side effects of headache, dizziness. She also finds the valium helps control pain some.   She did have indeterminate stress test due to abnormal baseline EKG and chest discomfort at peak exercise. Echocardiogram was normal with EF 55%, possible grade 2 diastolic dysfunction.   Chest discomfort described as pressure, associated with dyspnea and diaphoresis, some radiation to the back. Ongoing thoracic back pain. Previously thought costochondritis/sternalis syndrome however no improvement with NSAIDs or steroids.   Endorses sneezing, with minimal congestion, wonders about asthma/allergies contributing to her symptoms. Noticing increased cough over the past month. Denies wheezing. Some dyspnea with cough. Denies abd pain, no trouble with any food.   LMP 09/18/2015. S/p BTL.   Relevant past medical, surgical, family and social history reviewed and updated as indicated. Interim medical history since our last visit reviewed. Allergies and medications reviewed and updated. Current Outpatient Prescriptions on File Prior to Visit  Medication Sig  . acetaminophen (TYLENOL) 500 MG tablet Take 1,000 mg by mouth every 6 (six) hours as needed for mild pain or moderate pain.   Marland Kitchen albuterol (PROVENTIL HFA;VENTOLIN HFA) 108 (90 Base) MCG/ACT inhaler Inhale 2 puffs into the  lungs every 6 (six) hours as needed for wheezing or shortness of breath.  . diazepam (VALIUM) 5 MG tablet Take 1 tablet (5 mg total) by mouth every 8 (eight) hours as needed for muscle spasms.  . isosorbide mononitrate (IMDUR) 30 MG 24 hr tablet Take 1 tablet by mouth daily.  . rizatriptan (MAXALT) 5 MG tablet Take 1 tablet (5 mg total) by mouth as needed for migraine. May repeat in 2 hours if needed   No current facility-administered medications on file prior to visit.     Review of Systems Per HPI unless specifically indicated in ROS section     Objective:    BP 122/76   Pulse 76   Temp 97.9 F (36.6 C) (Oral)   Wt 126 lb 8 oz (57.4 kg)   LMP 09/19/2015 (Exact Date)   SpO2 99% Comment: RA  BMI 23.14 kg/m   Wt Readings from Last 3 Encounters:  11/05/15 126 lb 8 oz (57.4 kg)  10/19/15 123 lb 8 oz (56 kg)  10/05/15 123 lb (55.8 kg)    Physical Exam  Constitutional: She appears well-developed and well-nourished. No distress.  HENT:  Mouth/Throat: Oropharynx is clear and moist. No oropharyngeal exudate.  Neck: Neck supple. Thyromegaly (palpable L thyroid nodule) present.  Cardiovascular: Normal rate, regular rhythm, normal heart sounds and intact distal pulses.   No murmur heard. Pulmonary/Chest: Effort normal and breath sounds normal. No respiratory distress. She has no wheezes. She has no rales. She exhibits tenderness (mild mid lower sternal pain).  Abdominal: Soft.  Musculoskeletal: She exhibits no  edema.  + thoracic back pain  Lymphadenopathy:    She has no cervical adenopathy.  Skin: Skin is warm and dry. No rash noted.  Psychiatric: She has a normal mood and affect.  Nursing note and vitals reviewed.  Results for orders placed or performed in visit on [id number]  Basic metabolic panel  Result Value Ref Range   Sodium 137 135 - 145 mEq/L   Potassium 3.9 3.5 - 5.1 mEq/L   Chloride 105 96 - 112 mEq/L   CO2 26 19 - 32 mEq/L   Glucose, Bld 88 70 - 99 mg/dL   BUN 15 6  - 23 mg/dL   Creatinine, Ser 0.74 0.40 - 1.20 mg/dL   Calcium 9.1 8.4 - 10.5 mg/dL   GFR 89.50 >60.00 mL/min  TSH  Result Value Ref Range   TSH 1.41 0.35 - 4.50 uIU/mL  Sedimentation rate  Result Value Ref Range   Sed Rate 29 (H) 0 - 20 mm/hr  ANA  Result Value Ref Range   Anit Nuclear Antibody(ANA)  NEGATIVE  Vitamin B12  Result Value Ref Range   Vitamin B-12 241 211 - 911 pg/mL  Folate  Result Value Ref Range   Folate 22.0 >5.9 ng/mL  Ferritin  Result Value Ref Range   Ferritin 16.1 10.0 - 291.0 ng/mL  VITAMIN D 25 Hydroxy (Vit-D Deficiency, Fractures)  Result Value Ref Range   VITD 21.99 (L) 30.00 - 100.00 ng/mL  T4, free  Result Value Ref Range   Free T4 0.54 (L) 0.60 - 1.60 ng/dL  D-dimer, quantitative (not at [facility])  Result Value Ref Range   D-Dimer, Quant 0.23 <0.50 mcg/mL FEU   ANA pending  THORACIC SPINE - 3 VIEWS COMPARISON:  10/07/2015. FINDINGS: No acute bony abnormality identified. Normal alignment. Mild diffuse degenerative change. IMPRESSION: Mild diffuse degenerative change.  No acute abnormality Electronically Signed   By: [place]   On: 10/19/2015 09:24  ABDOMEN ULTRASOUND COMPLETE IMPRESSION: 1. No gallstones or sonographic evidence of acute cholecystitis. If there are clinical concerns of chronic cholecystitis, a nuclear medicine hepatobiliary scan with gallbladder ejection fraction determination may be useful. 2. No acute abnormality is observed elsewhere within the abdomen. Electronically Signed   By: David  [place]ique M.D.   On: 10/26/2015 09:35  CT CHEST WITH CONTRAST CONTRAST:  81mL ISOVUE-300 IOPAMIDOL (ISOVUE-300) INJECTION 61% COMPARISON:  None. FINDINGS: Cardiovascular: No significant vascular findings. Normal heart size. No pericardial effusion. Mediastinum/Nodes: Bilateral calcified hilar lymph nodes. No mediastinal adenopathy. The Lungs/Pleura: Lungs are clear. No pleural effusion or pneumothorax. Upper Abdomen: No  acute abnormality. Laminar flow artifact in the portal vein. Musculoskeletal: No chest wall abnormality. No acute or significant osseous findings. IMPRESSION: Clear lungs. Small calcified hilar lymph nodes consistent with old granulomatous disease. Electronically Signed   By: Ashley Royalty M.D.   On: 10/31/2015 20:21    Assessment & Plan:   Problem List Items Addressed This Visit    RESOLVED: Abnormal CXR    CT scan unrevealing.       Chest wall pain    Improving, but persistent thoracic back pain - see below.  Requests lifting restrictions to be removed for work - letter provided.       Relevant Orders   Basic metabolic panel (Completed)   Sedimentation rate (Completed)   ANA (Completed)   Fatigue    Further labs to evaluate for reversible causes of fatigue.      Relevant Orders   Vitamin B12 (Completed)  Folate (Completed)   Ferritin (Completed)   VITAMIN D 25 Hydroxy (Vit-D Deficiency, Fractures) (Completed)   Left thyroid nodule    Palpable. Update TSH and free T4. Neck fullness/soreness has resolved.       Relevant Orders   TSH (Completed)   T4, free (Completed)   Nonspecific abnormal electrocardiogram (ECG) (EKG)    Baseline EKG abnormal as well as echo showing diastolic dysfunction - encouraged f/u with Dr Nehemiah Massed. She has not tolerated imdur. Chest pain improving some.       Sneezing    She thinks she may be exposed to environmental allergen at work and is worried allergies are contributing to her symptoms, requests allergist referral.       Relevant Orders   Ambulatory referral to Allergy   Thoracic spine pain - Primary    Anticipate symptoms stemming from posterior chest wall pain syndrome. Discussed with patient, encouraged gentle stretching, heating pad, and PT. Pt declines PT at this time. Will continue valium PRN pain at this time.       Other Visit Diagnoses    Dyspnea, unspecified type       Relevant Orders   Basic metabolic panel  (Completed)   D-dimer, quantitative (not at [facility]) (Completed)       Follow up plan: Return if symptoms worsen or fail to improve.  Ria Bush, MD

## 2015-11-06 DIAGNOSIS — R067 Sneezing: Secondary | ICD-10-CM | POA: Insufficient documentation

## 2015-11-06 LAB — ANA: ANA: NEGATIVE

## 2015-11-06 LAB — D-DIMER, QUANTITATIVE (NOT AT ARMC): D DIMER QUANT: 0.23 ug{FEU}/mL (ref <0.50)

## 2015-11-06 NOTE — Assessment & Plan Note (Addendum)
She thinks she may be exposed to environmental allergen at work and is worried allergies are contributing to her symptoms, requests allergist referral.

## 2015-11-06 NOTE — Assessment & Plan Note (Signed)
CT scan unrevealing.

## 2015-11-06 NOTE — Assessment & Plan Note (Signed)
Baseline EKG abnormal as well as echo showing diastolic dysfunction - encouraged f/u with Dr Nehemiah Massed. She has not tolerated imdur. Chest pain improving some.

## 2015-11-06 NOTE — Assessment & Plan Note (Addendum)
Anticipate symptoms stemming from posterior chest wall pain syndrome. Discussed with patient, encouraged gentle stretching, heating pad, and PT. Pt declines PT at this time. Will continue valium PRN pain at this time.

## 2015-11-06 NOTE — Assessment & Plan Note (Signed)
Further labs to evaluate for reversible causes of fatigue.

## 2015-11-06 NOTE — Assessment & Plan Note (Addendum)
Palpable. Update TSH and free T4. Neck fullness/soreness has resolved.

## 2015-11-06 NOTE — Assessment & Plan Note (Addendum)
Improving, but persistent thoracic back pain - see below.  Requests lifting restrictions to be removed for work - letter provided.

## 2015-11-07 ENCOUNTER — Other Ambulatory Visit: Payer: Self-pay | Admitting: Family Medicine

## 2015-11-07 MED ORDER — VITAMIN B-12 1000 MCG PO TABS: 1000.0000 ug | ORAL_TABLET | Freq: Every day | ORAL | Status: AC

## 2015-11-07 MED ORDER — FERROUS SULFATE 325 (65 FE) MG PO TABS: 325.0000 mg | ORAL_TABLET | Freq: Every day | ORAL | Status: DC

## 2015-11-07 MED ORDER — VITAMIN D3 25 MCG (1000 UT) PO CAPS: 1.0000 | ORAL_CAPSULE | Freq: Every day | ORAL | Status: DC

## 2015-11-12 ENCOUNTER — Telehealth: Payer: Self-pay | Admitting: Family Medicine

## 2015-11-12 DIAGNOSIS — R9431 Abnormal electrocardiogram [ECG] [EKG]: Secondary | ICD-10-CM

## 2015-11-12 DIAGNOSIS — R0789 Other chest pain: Secondary | ICD-10-CM

## 2015-11-12 DIAGNOSIS — I208 Other forms of angina pectoris: Secondary | ICD-10-CM

## 2015-11-12 MED ORDER — DIAZEPAM 5 MG PO TABS: 5.0000 mg | ORAL_TABLET | Freq: Three times a day (TID) | ORAL | 0 refills | Status: DC | PRN

## 2015-11-12 NOTE — Telephone Encounter (Signed)
This was not in my box?

## 2015-11-12 NOTE — Telephone Encounter (Signed)
This was in my box up front. In your IN box now.

## 2015-11-12 NOTE — Telephone Encounter (Signed)
Pt walkede in and left note for PCP for review.  Best number to reach pt is [phone], note placed in In box for PCP

## 2015-11-12 NOTE — Telephone Encounter (Signed)
Spoke with patient.  Pleasant 45 yo with concerning chest pain symptoms with abnormal EKG over last several months, pt not satisfied with current cardiologist eval. Pending stress echo later this month. Requests referral for second opinion - will place urgent referral to [facility] heartcare.

## 2015-11-13 NOTE — Telephone Encounter (Signed)
Appt made with Dr Yvone Neu on 11/15/15 with Denise Gordon, patient aware.

## 2015-11-15 ENCOUNTER — Encounter: Payer: Self-pay | Admitting: Cardiology

## 2015-11-15 ENCOUNTER — Ambulatory Visit (INDEPENDENT_AMBULATORY_CARE_PROVIDER_SITE_OTHER): Payer: No Typology Code available for payment source | Admitting: Cardiology

## 2015-11-15 VITALS — BP 110/68 | HR 63 | Ht 63.0 in | Wt 124.8 lb

## 2015-11-15 DIAGNOSIS — R079 Chest pain, unspecified: Secondary | ICD-10-CM

## 2015-11-15 DIAGNOSIS — R9431 Abnormal electrocardiogram [ECG] [EKG]: Secondary | ICD-10-CM | POA: Diagnosis not present

## 2015-11-15 NOTE — Patient Instructions (Signed)
Testing/Procedures: Your physician has requested that you have cardiac CT. Cardiac computed tomography (CT) is a painless test that uses an x-ray machine to take clear, detailed pictures of your heart. For further information please visit [email or web address]. Please follow instruction sheet as given.  Follow-Up: Your physician recommends that you schedule a follow-up appointment as needed with Dr. Yvone Neu. We will call you with results and if needed schedule follow up at that time.   It was a pleasure seeing you today here in the office. Please do not hesitate to give [place] a call back if you have any further questions. [place], BSN

## 2015-11-15 NOTE — Progress Notes (Signed)
Cardiology Office Note   Date:  11/15/2015   ID:  XCARET TIMPERLEY, DOB 10-Jan-1971, MRN [id number]  Referring Doctor:  Ria Bush, MD   Cardiologist:   Wende Bushy, MD   Reason for consultation:  Chief Complaint  Patient presents with  . other    Pt wanting second opinion see's Nehemiah Massed c/o chest pain radiates to back for 3 months meds don't help. Pt mentioned headaches with isosorbide. Meds reviewed verbally with pt.      History of Present Illness: Denise Gordon is a 45 y.o. female who presents for Chest pain. This has been going on for about 3 months now. She describes a discomfort in the center of the chest, mild to moderate in severity, sometimes radiating to the back. First thing in the morning, she feels great does not have any chest pain. As the day goes on, she starts having the chest discomfort. This is somehow relieved by Tylenol. 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 She moves her upper arms a lot. The chest discomfort is on and off throughout the day.  Patient denies shortness of breath, palpitations, PND, orthopnea, edema.   ROS:  Please see the history of present illness. Aside from mentioned under HPI, all other systems are reviewed and negative.     Past Medical History:  Diagnosis Date  . Chicken pox   . Dysmenorrhea   . GERD (gastroesophageal reflux disease) 12/25/2014  . Menorrhagia with regular cycle   . Menstrual migraine     Past Surgical History:  Procedure Laterality Date  . TUBAL LIGATION  2004     reports that she has never smoked. She has never used smokeless tobacco. She reports that she drinks alcohol. She reports that she does not use drugs.   family history includes Arthritis in her mother; Asthma in her sister; CAD (age of onset: 35) in her cousin; CAD (age of onset: 74) in her maternal aunt; Cancer in her maternal aunt; Cancer (age of onset: 40) in her maternal grandmother; Hypertension in her  mother.   Outpatient Medications Prior to Visit  Medication Sig Dispense Refill  . acetaminophen (TYLENOL) 500 MG tablet Take 1,000 mg by mouth every 6 (six) hours as needed for mild pain or moderate pain.     Marland Kitchen albuterol (PROVENTIL HFA;VENTOLIN HFA) 108 (90 Base) MCG/ACT inhaler Inhale 2 puffs into the lungs every 6 (six) hours as needed for wheezing or shortness of breath. 1 Inhaler 2  . diazepam (VALIUM) 5 MG tablet Take 1 tablet (5 mg total) by mouth every 8 (eight) hours as needed for muscle spasms. 20 tablet 0  . isosorbide mononitrate (IMDUR) 30 MG 24 hr tablet Take 0.5 tablets by mouth daily.     . rizatriptan (MAXALT) 5 MG tablet Take 1 tablet (5 mg total) by mouth as needed for migraine. May repeat in 2 hours if needed 10 tablet 1  . vitamin B-12 (CYANOCOBALAMIN) 1000 MCG tablet Take 1 tablet (1,000 mcg total) by mouth daily.    . Cholecalciferol (VITAMIN D3) 1000 units CAPS Take 1 capsule (1,000 Units total) by mouth daily. (Patient not taking: Reported on 11/15/2015) 30 capsule   . ferrous sulfate 325 (65 FE) MG tablet Take 1 tablet (325 mg total) by mouth daily with breakfast. (Patient not taking: Reported on 11/15/2015)     No facility-administered medications prior to visit.      Allergies: Review of patient's allergies indicates  no known allergies.    PHYSICAL EXAM: VS:  Ht 5\' 3"  (1.6 m)   Wt 124 lb 12 oz (56.6 kg)   LMP 09/19/2015 (Exact Date)   BMI 22.10 kg/m  , Body mass index is 22.1 kg/m. Wt Readings from Last 3 Encounters:  11/15/15 124 lb 12 oz (56.6 kg)  11/05/15 126 lb 8 oz (57.4 kg)  10/19/15 123 lb 8 oz (56 kg)    GENERAL:  well developed, well nourished, not in acute distress HEENT: normocephalic, pink conjunctivae, anicteric sclerae, no xanthelasma, normal dentition, oropharynx clear NECK:  no neck vein engorgement, JVP normal, no hepatojugular reflux, carotid upstroke brisk and symmetric, no bruit, no thyromegaly, no lymphadenopathy LUNGS:  good  respiratory effort, clear to auscultation bilaterally CV:  PMI not displaced, no thrills, no lifts, S1 and S2 within normal limits, no palpable S3 or S4, no murmurs, no rubs, no gallops ABD:  Soft, nontender, nondistended, normoactive bowel sounds, no abdominal aortic bruit, no hepatomegaly, no splenomegaly MS: nontender back, no kyphosis, no scoliosis, no joint deformities EXT:  2+ DP/PT pulses, no edema, no varicosities, no cyanosis, no clubbing SKIN: warm, nondiaphoretic, normal turgor, no ulcers NEUROPSYCH: alert, oriented to person, place, and time, sensory/motor grossly intact, normal mood, appropriate affect  Recent Labs: 10/19/2015: ALT 11; Hemoglobin 12.7; Platelets 261.0 11/05/2015: BUN 15; Creatinine, Ser 0.74; Potassium 3.9; Sodium 137; TSH 1.41   Lipid Panel    Component Value Date/Time   CHOL 219 (H) 07/24/2015 0000   TRIG 184 (H) 07/24/2015 0000   HDL 76 07/24/2015 0000   CHOLHDL 2.9 07/24/2015 0000   CHOLHDL 2 08/09/2014 1512   VLDL 16.6 08/09/2014 1512   LDLCALC 106 (H) 07/24/2015 0000     Other studies Reviewed:  EKG:  The ekg from 11/15/2015 was personally reviewed by me and it revealed sinus rhythm, 63 BPM. ST-T wave changes in the inferior and anterior leads. PR is 106 ms, QT QTC within normal limits.  Additional studies/ records that were reviewed personally reviewed by me today include: None available in her system Echo 10/10/2015: INTERPRETATION NORMAL LEFT VENTRICULAR SYSTOLIC FUNCTION NORMAL RIGHT VENTRICULAR SYSTOLIC FUNCTION MILD VALVULAR REGURGITATION (See above) NO VALVULAR STENOSIS   ASSESSMENT AND PLAN: Chest pain Abnormal EKG She was ruled out from ACS in the hospital, ER. In terms of risk factors for CAD, she does not have a lot of risk factors. No true establish CAD in first-degree relatives. She does not have high blood pressure, hyperlipidemia, nonsmoker. Apparently, per review of medical records, she was reported to have an abnormal  treadmill stress test. She is scheduled to go for stress echocardiogram. Patient is very concerned about her symptoms plus the fact that her EKG is abnormal. Recommend further evaluation with a coronary CTA for definitive rule out of coronary disease. Discussed the nature of procedure, risk and benefits. Patient would like to proceed with this test.   Current medicines are reviewed at length with the patient today.  The patient does not have concerns regarding medicines.  Labs/ tests ordered today include:  Orders Placed This Encounter  Procedures  . EKG 12-Lead    I had a lengthy and detailed discussion with the patient regarding diagnoses, prognosis, diagnostic options, treatment options  and side effects of medications.   I counseled the patient on importance of lifestyle modification including heart healthy diet, regular physical activity Once cardiac workup is completed.  Disposition:   FU with undersigned after tests    Signed, Wende Bushy,  MD  11/15/2015 3:33 PM    [place]  This note was generated in part with voice recognition software and I apologize for any typographical errors that were not detected and corrected.

## 2015-11-20 ENCOUNTER — Telehealth: Payer: Self-pay | Admitting: Cardiology

## 2015-11-20 NOTE — Telephone Encounter (Signed)
Spoke with patient and let her know that someone from [place] will contact her soon to get this scheduled. She verbalized understanding and had no further questions at this time.

## 2015-11-20 NOTE — Telephone Encounter (Signed)
Pt boyfriend calling stating last time they were told to get a Cardiac CT scheduled They have not heard anything back would like a call back Please advise  [phone]

## 2015-11-20 NOTE — Telephone Encounter (Signed)
Left voicemail message to call back  

## 2015-11-26 NOTE — Telephone Encounter (Signed)
Pam, is this for a CT calcium score?

## 2015-11-26 NOTE — Telephone Encounter (Signed)
Patient calling stating she has still not heard anything about scheduling CT.  Please call to schedule.

## 2015-11-27 NOTE — Telephone Encounter (Signed)
Spoke with Denise Gordon in [place] and she states that insurance is still processing request and that she will schedule with her when that has been completed. Left voicemail message for patient letting her know that someone will be in touch when that has been completed.

## 2015-11-28 ENCOUNTER — Encounter: Payer: Self-pay | Admitting: Cardiology

## 2015-12-04 ENCOUNTER — Telehealth: Payer: Self-pay | Admitting: Cardiology

## 2015-12-04 ENCOUNTER — Ambulatory Visit (HOSPITAL_COMMUNITY)
Admission: RE | Admit: 2015-12-04 | Discharge: 2015-12-04 | Disposition: A | Discharge status: Home / Self Care | Payer: No Typology Code available for payment source | Source: Ambulatory Visit | Attending: Cardiology | Admitting: Cardiology

## 2015-12-04 ENCOUNTER — Encounter (HOSPITAL_COMMUNITY): Payer: Self-pay

## 2015-12-04 DIAGNOSIS — R918 Other nonspecific abnormal finding of lung field: Secondary | ICD-10-CM | POA: Diagnosis not present

## 2015-12-04 DIAGNOSIS — R079 Chest pain, unspecified: Secondary | ICD-10-CM

## 2015-12-04 MED ORDER — IOPAMIDOL (ISOVUE-370) INJECTION 76%
INTRAVENOUS | Status: AC
  Administered 2015-12-04: 80 mL
  Filled 2015-12-04: qty 100

## 2015-12-04 MED ORDER — NITROGLYCERIN 0.4 MG SL SUBL
0.8000 mg | SUBLINGUAL_TABLET | Freq: Once | SUBLINGUAL | Status: AC
  Administered 2015-12-04: 0.8 mg via SUBLINGUAL

## 2015-12-04 MED ORDER — NITROGLYCERIN 0.4 MG SL SUBL
SUBLINGUAL_TABLET | SUBLINGUAL | Status: AC
  Filled 2015-12-04: qty 2

## 2015-12-04 NOTE — Telephone Encounter (Signed)
Error

## 2015-12-04 NOTE — Discharge Instructions (Signed)
Introduction _[name]__ needs to be excused from: __x__ Work ____ [employer] ____ Physical activity beginning now and through the following date: _____11/21/17___________. He or she may return to work or school but should still avoid the following physical activity or activities from now until __11/21/17____________. Activity restrictions include: ____ Lifting more than _______ lb ____ Sitting longer than __________ minutes at a time ____ Standing longer than ________ minutes at a time ____ He or she may return to full physical activity as of _______11/21/17_________. Health Care Provider Name (printed): Liane Comber, MD______________________________ Health Care Provider (signature): ___________________________________________ Date: ________________ This information is not intended to replace advice given to you by your health care provider. Make sure you discuss any questions you have with your health care provider. Document Released: 06/25/2000 Document Revised: 07/20/2015 Document Reviewed: 08/01/2013  2017 Elsevier

## 2015-12-04 NOTE — Progress Notes (Signed)
CT scan completed. Tolerated well. D/C home with husband, walking, awake and alert. In no distress.

## 2015-12-05 ENCOUNTER — Telehealth: Payer: Self-pay | Admitting: Cardiology

## 2015-12-05 NOTE — Telephone Encounter (Signed)
Patient called wanting her CT results. Please call patient. Thanks

## 2015-12-05 NOTE — Telephone Encounter (Signed)
Left message on machine for patient to contact the office.   

## 2015-12-05 NOTE — Telephone Encounter (Signed)
Pt called back, states she was told results of coronary CTA would be available today. Will forward to Dr. Yvone Neu to review and advise.

## 2015-12-10 NOTE — Telephone Encounter (Signed)
Results called to pt

## 2015-12-18 ENCOUNTER — Encounter: Payer: Self-pay | Admitting: Family Medicine

## 2016-03-17 ENCOUNTER — Telehealth: Payer: Self-pay | Admitting: Family Medicine

## 2016-03-17 NOTE — Telephone Encounter (Signed)
Pt stated it was for migranes

## 2016-03-17 NOTE — Telephone Encounter (Signed)
Left message asking pt to call office.  Have ? About the [place] paperwork that was faxed to office

## 2016-03-20 NOTE — Telephone Encounter (Signed)
Paperwork in dr g in box Pt aware dr go out of office

## 2016-03-28 ENCOUNTER — Other Ambulatory Visit: Payer: Self-pay

## 2016-03-28 NOTE — Telephone Encounter (Signed)
Last filled 07/2015-- please advise if okay to refill

## 2016-03-28 NOTE — Telephone Encounter (Signed)
Left message  Letting pt know paperwork has been faxed and a copy here for her  Copy for pt Copy for file Copy for scan

## 2016-03-28 NOTE — Telephone Encounter (Signed)
Filled and in my outbox

## 2016-03-29 MED ORDER — RIZATRIPTAN BENZOATE 5 MG PO TABS
5.0000 mg | ORAL_TABLET | ORAL | 3 refills | Status: DC | PRN
Start: 2016-03-29 — End: 2019-03-16

## 2016-06-30 IMAGING — US US TRANSVAGINAL NON-OB
1 series · 13 of 25 positions shown · non-contrast
Comparison: None

CLINICAL DATA: 44-year-old female with dysmenorrhea, menorrhagia x1
year but increasing x6 months. Premenopausal, not on hormone
replacement therapy. LMP 12/18/2014.



[Series 1: us transvaginal non-ob · 0.21mm/px · 13 of 173 slices shown]
[im 1/173]
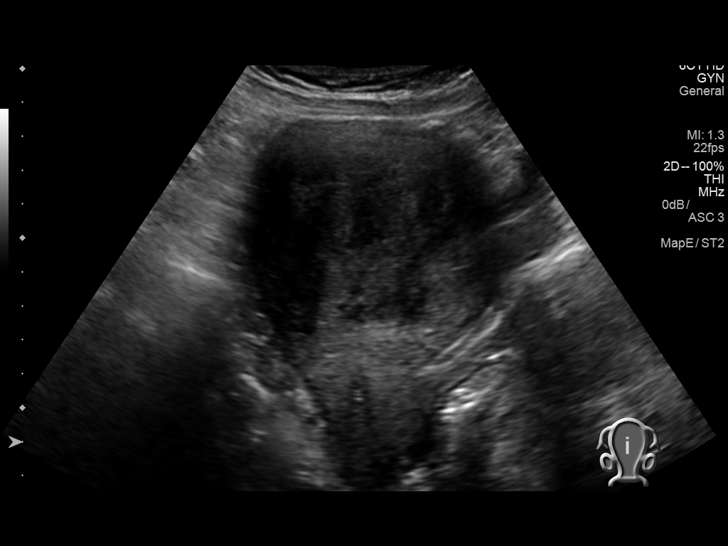
[im 15/173]
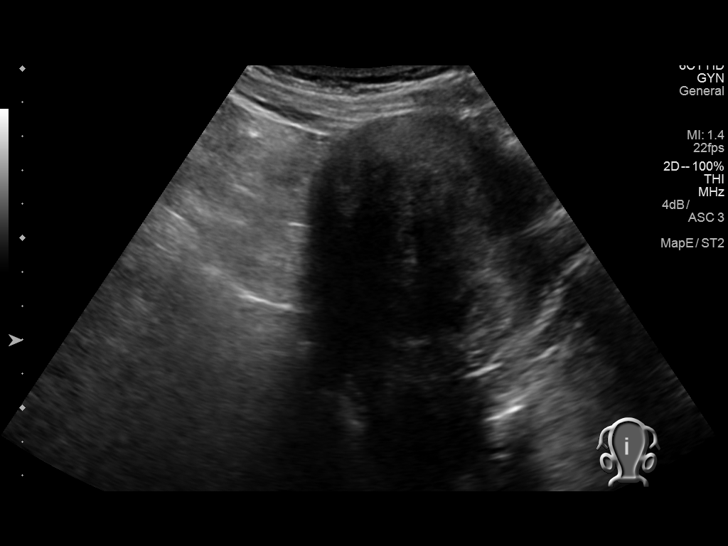
[im 29/173]
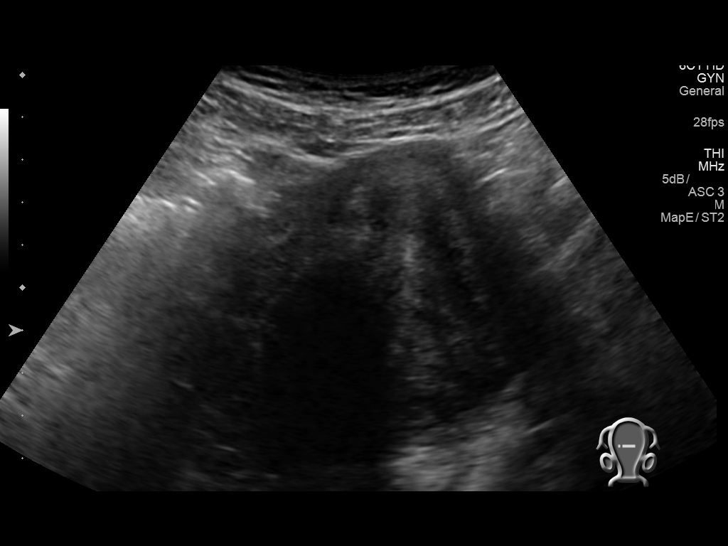
[im 44/173]
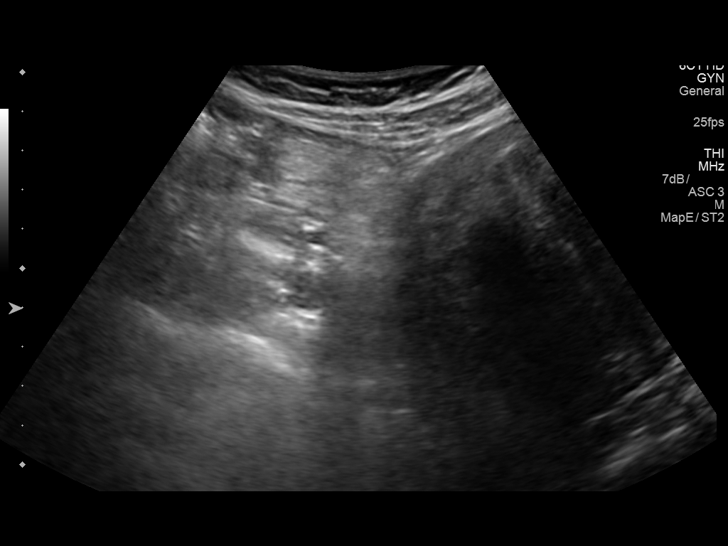
[im 58/173]
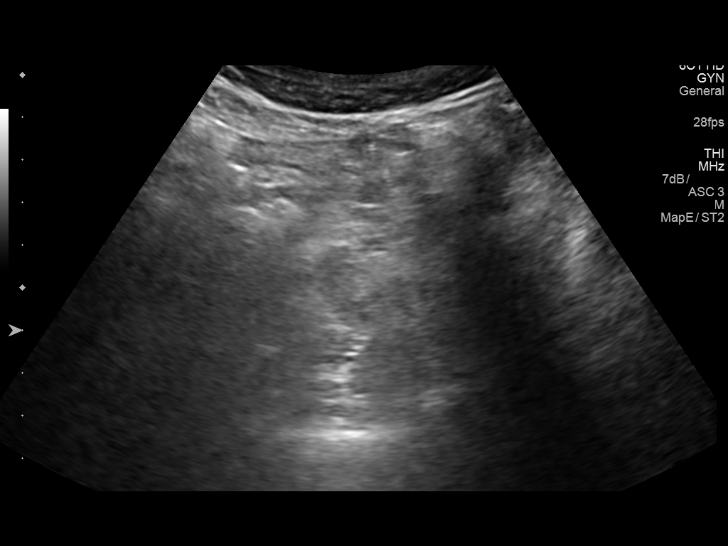
[im 72/173]
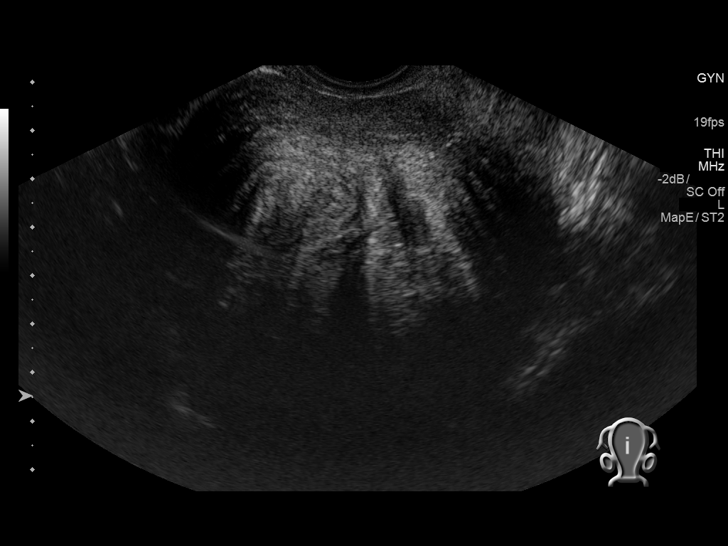
[im 87/173]
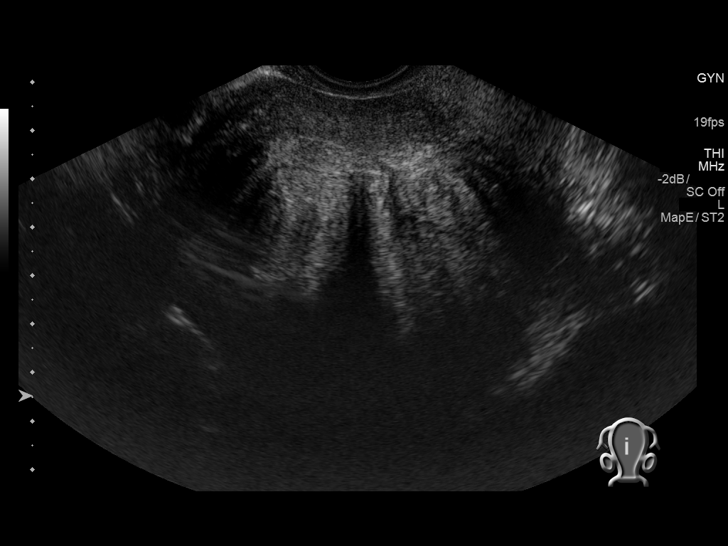
[im 101/173]
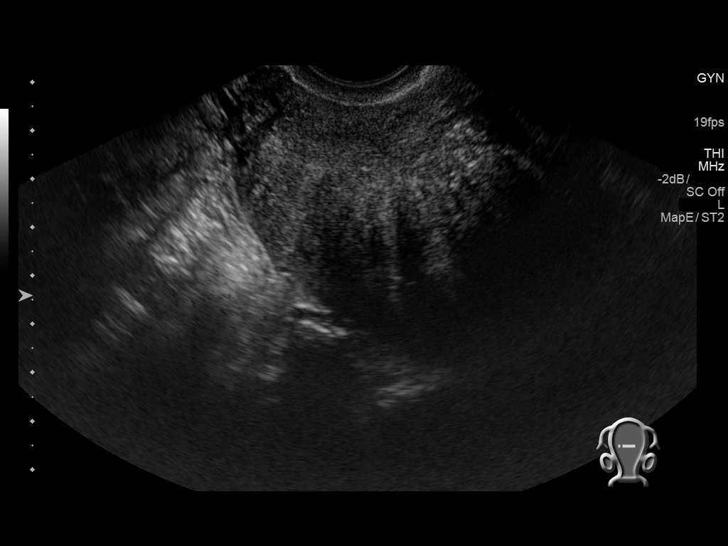
[im 115/173]
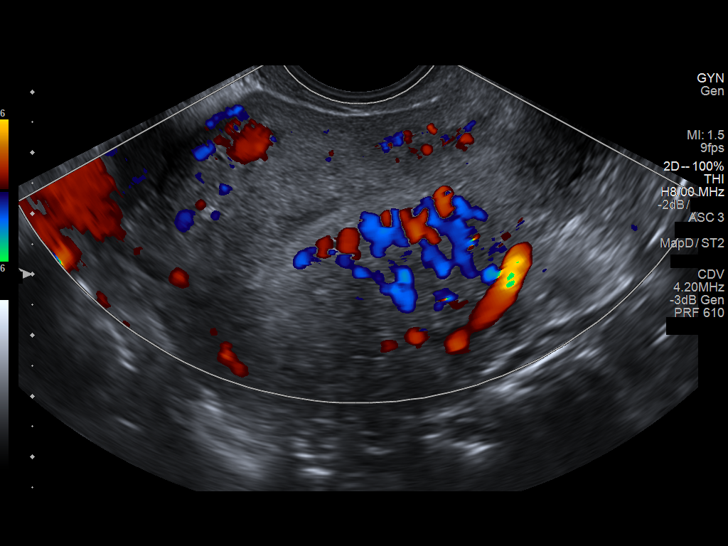
[im 130/173]
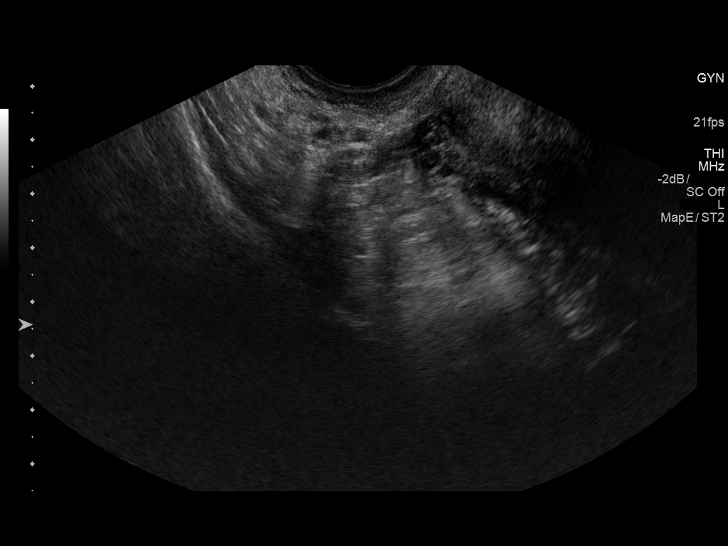
[im 144/173]
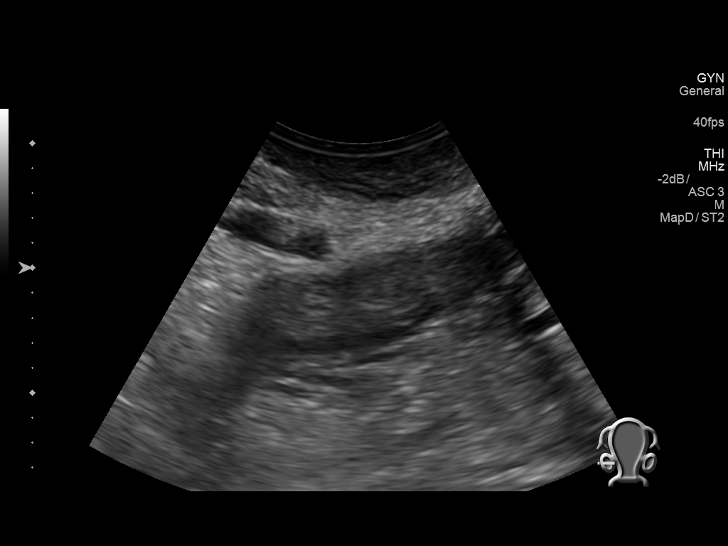
[im 158/173]
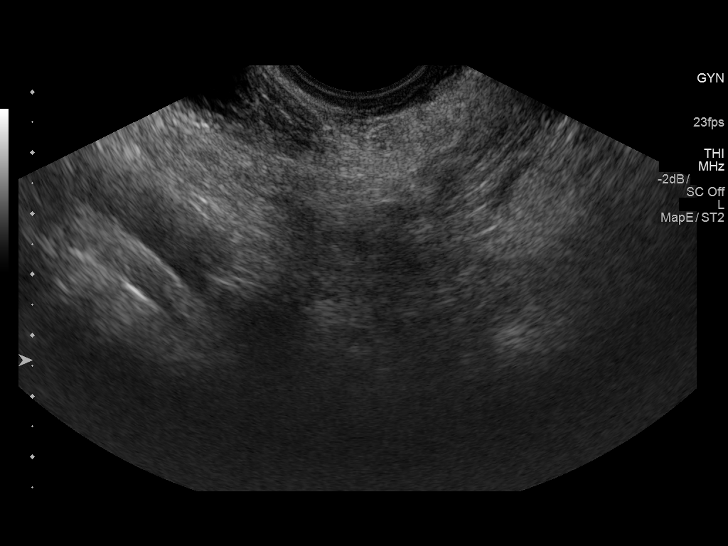
[im 173/173]
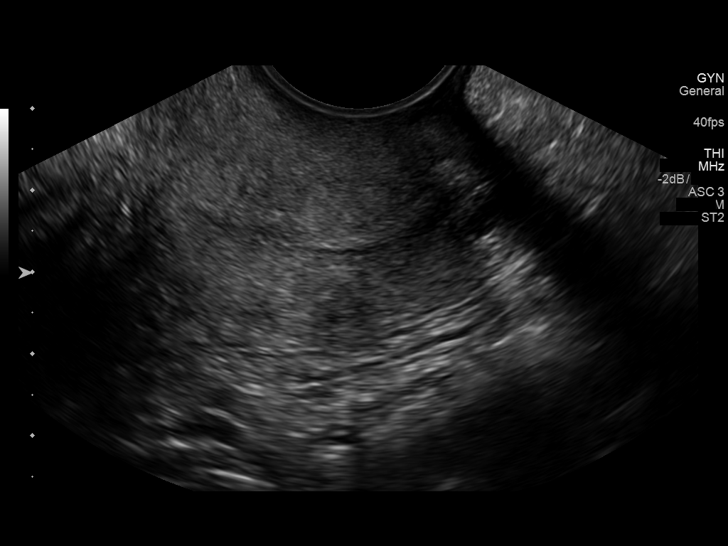

[13 of 25 positions shown; findings below may reference images not displayed]

FINDINGS: Uterus

Measurements: 11.0 x 7.3 x 9.2 cm. Multiple fibroids, the largest is
in the fundus measuring 6 cm diameter. Smaller 3.5 cm fundal versus
body fibroid (image 24). These are intramural versus submucosal.

Endometrium

Thickness: 7 mm. Hypervascularity near the endometrium (image 133)
appears related to a fibroid. No focal abnormality visualized.

Right ovary

Measurements: 2.0 x 0.7 x 2.1 cm. Normal appearance/no adnexal mass.

Left ovary

Measurements: Could not be identified despite trans abdominal and
transvaginal imaging..

Other findings

No abnormal free fluid.
IMPRESSION: 1. Multiple fibroids, at least 1 appears to be submucosal. The
largest is 6 cm diameter.
2. Endometrium within normal limits. If bleeding remains
unresponsive to hormonal or medical therapy, sonohysterogram should
be considered for focal lesion work-up. (Ref: Radiological
Reasoning: Algorithmic Workup of Abnormal Vaginal Bleeding with
Endovaginal Sonography and Sonohysterography. AJR [id number]; 191:S68-73).
3. Normal right ovary, the left could not be identified. No pelvic
free fluid.

## 2016-08-07 ENCOUNTER — Ambulatory Visit: Payer: Self-pay | Admitting: *Deleted

## 2016-08-07 VITALS — BP 124/77 | Ht 62.5 in | Wt 124.0 lb

## 2016-08-07 DIAGNOSIS — Z0000 Encounter for general adult medical examination without abnormal findings: Secondary | ICD-10-CM

## 2016-08-07 NOTE — Progress Notes (Signed)
[employer] insurance premium discount evaluation: Labs Drawn. [employer] ROI form signed. Tobacco Free Attestation form signed.  Forms placed in paper chart.  Ok to route results to pcp per pt.

## 2016-08-08 LAB — CMP12+LP+TP+TSH+6AC+CBC/D/PLT
ALBUMIN: 4.4 g/dL (ref 3.5–5.5)
ALK PHOS: 59 IU/L (ref 39–117)
ALT: 10 IU/L (ref 0–32)
AST: 21 IU/L (ref 0–40)
Albumin/Globulin Ratio: 1.5 (ref 1.2–2.2)
BASOS ABS: 0 10*3/uL (ref 0.0–0.2)
BASOS: 0 %
BILIRUBIN TOTAL: 0.2 mg/dL (ref 0.0–1.2)
BUN/Creatinine Ratio: 14 (ref 9–23)
BUN: 9 mg/dL (ref 6–24)
CHOLESTEROL TOTAL: 190 mg/dL (ref 100–199)
Calcium: 8.9 mg/dL (ref 8.7–10.2)
Chloride: 103 mmol/L (ref 96–106)
Chol/HDL Ratio: 2.2 ratio (ref 0.0–4.4)
Creatinine, Ser: 0.64 mg/dL (ref 0.57–1.00)
EOS (ABSOLUTE): 0.1 10*3/uL (ref 0.0–0.4)
Eos: 2 %
Estimated CHD Risk: <0.5 times avg. (ref 0.0–1.0)
FREE THYROXINE INDEX: 1.5 (ref 1.2–4.9)
GFR calc Af Amer: 123 mL/min/{1.73_m2} (ref >59)
GFR calc non Af Amer: 107 mL/min/{1.73_m2} (ref >59)
GGT: 15 IU/L (ref 0–60)
GLOBULIN, TOTAL: 2.9 g/dL (ref 1.5–4.5)
Glucose: 74 mg/dL (ref 65–99)
HDL: 86 mg/dL (ref >39)
HEMATOCRIT: 37.5 % (ref 34.0–46.6)
HEMOGLOBIN: 11.8 g/dL (ref 11.1–15.9)
IMMATURE GRANS (ABS): 0 10*3/uL (ref 0.0–0.1)
IMMATURE GRANULOCYTES: 0 %
Iron: 35 ug/dL (ref 27–159)
LDH: 203 IU/L (ref 119–226)
LDL CALC: 89 mg/dL (ref 0–99)
LYMPHS: 30 %
Lymphocytes Absolute: 1.7 10*3/uL (ref 0.7–3.1)
MCH: 29.2 pg (ref 26.6–33.0)
MCHC: 31.5 g/dL (ref 31.5–35.7)
MCV: 93 fL (ref 79–97)
MONOS ABS: 0.3 10*3/uL (ref 0.1–0.9)
Monocytes: 6 %
NEUTROS PCT: 62 %
Neutrophils Absolute: 3.4 10*3/uL (ref 1.4–7.0)
PLATELETS: 325 10*3/uL (ref 150–379)
Phosphorus: 3.5 mg/dL (ref 2.5–4.5)
Potassium: 4.5 mmol/L (ref 3.5–5.2)
RBC: 4.04 x10E6/uL (ref 3.77–5.28)
RDW: 14.4 % (ref 12.3–15.4)
SODIUM: 141 mmol/L (ref 134–144)
T3 UPTAKE RATIO: 24 % (ref 24–39)
T4, Total: 6.2 ug/dL (ref 4.5–12.0)
TRIGLYCERIDES: 77 mg/dL (ref 0–149)
TSH: 0.954 u[IU]/mL (ref 0.450–4.500)
Total Protein: 7.3 g/dL (ref 6.0–8.5)
Uric Acid: 3.1 mg/dL (ref 2.5–7.1)
VLDL CHOLESTEROL CAL: 15 mg/dL (ref 5–40)
WBC: 5.5 10*3/uL (ref 3.4–10.8)

## 2016-08-08 LAB — HGB A1C W/O EAG: Hgb A1c MFr Bld: 5.2 % (ref 4.8–5.6)

## 2016-08-08 NOTE — Progress Notes (Signed)
Results reviewed with pt. All labs WNL. LP improved from previous. Recommendations for general diet and exercise for overall health given. Copy provided to pt. Results routed to pcp per pt request. No further questions/concerns.

## 2016-09-25 ENCOUNTER — Ambulatory Visit: Payer: Self-pay | Admitting: Registered Nurse

## 2016-09-25 VITALS — BP 132/60 | HR 66 | Temp 98.6°F

## 2016-09-25 DIAGNOSIS — L282 Other prurigo: Secondary | ICD-10-CM

## 2016-09-25 MED ORDER — CETIRIZINE HCL 10 MG PO TABS: 10.0000 mg | ORAL_TABLET | Freq: Every day | ORAL | 11 refills | Status: DC

## 2016-09-25 MED ORDER — DIPHENHYDRAMINE HCL 2 % EX GEL: 1.0000 "application " | Freq: Two times a day (BID) | CUTANEOUS | 0 refills | Status: AC | PRN

## 2016-09-25 MED ORDER — HYDROCORTISONE 1 % EX LOTN: 1.0000 "application " | TOPICAL_LOTION | Freq: Two times a day (BID) | CUTANEOUS | 0 refills | Status: AC | PRN

## 2016-09-25 NOTE — Progress Notes (Signed)
Subjective:    Patient ID: Denise Gordon, female    DOB: Jun 26, 1970, 46 y.o.   MRN: [id number]  46y/o established caucasian patient here for evaluation of rash bilateral thighs, arm and neck states very itchy and has not gone away with home treatment for that past couple weeks.  Has also noticed some bruising on thighs.  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  Patient in same work section reported to clinic today with similar rash and symptoms.  PMHx seasonal allergies states haven't been bothering her lately takes OTC anithistamine prn      Review of Systems  Constitutional: Negative for activity change, appetite change, chills, diaphoresis, fatigue, fever and unexpected weight change.  HENT: Negative for congestion, dental problem, drooling, ear discharge, ear pain, facial swelling, hearing loss, mouth sores, nosebleeds, postnasal drip, rhinorrhea, sinus pain, sinus pressure, sneezing, sore throat, tinnitus, trouble swallowing and voice change.   Eyes: Negative for photophobia, pain, discharge, redness, itching and visual disturbance.  Respiratory: Negative for cough, choking, chest tightness, shortness of breath, wheezing and stridor.   Cardiovascular: Negative for chest pain, palpitations and leg swelling.  Gastrointestinal: Negative for abdominal distention, abdominal pain, blood in stool, constipation, diarrhea, nausea and vomiting.  Endocrine: Negative for cold intolerance and heat intolerance.  Genitourinary: Negative for difficulty urinating, dysuria and hematuria.  Musculoskeletal: Negative for arthralgias, back pain, gait problem, joint swelling, myalgias, neck pain and neck stiffness.  Skin: Positive for color change and rash. Negative for pallor and wound.  Allergic/Immunologic: Positive for environmental allergies. Negative for food allergies.  Neurological: Positive for headaches. Negative for  dizziness, tremors, seizures, syncope, facial asymmetry, speech difficulty, weakness, light-headedness and numbness.  Hematological: Negative for adenopathy. Does not bruise/bleed easily.  Psychiatric/Behavioral: Negative for agitation, behavioral problems, confusion and sleep disturbance.       Objective:   Physical Exam  Constitutional: She is oriented to person, place, and time. Vital signs are normal. She appears well-developed and well-nourished. She is active and cooperative.  Non-toxic appearance. She does not have a sickly appearance. She does not appear ill. No distress.  HENT:  Head: Normocephalic and atraumatic.  Right Ear: Hearing, external ear and ear canal normal. A middle ear effusion is present.  Left Ear: Hearing, external ear and ear canal normal. A middle ear effusion is present.  Nose: Mucosal edema and rhinorrhea present. No nose lacerations, sinus tenderness, nasal deformity, septal deviation or nasal septal hematoma. No epistaxis.  No foreign bodies. Right sinus exhibits no maxillary sinus tenderness and no frontal sinus tenderness. Left sinus exhibits no maxillary sinus tenderness and no frontal sinus tenderness.  Mouth/Throat: Uvula is midline and mucous membranes are normal. Mucous membranes are not pale, not dry and not cyanotic. She does not have dentures. No oral lesions. No trismus in the jaw. Normal dentition. No dental abscesses, uvula swelling, lacerations or dental caries. Posterior oropharyngeal edema and posterior oropharyngeal erythema present. No oropharyngeal exudate or tonsillar abscesses.  Cobblestoning posterior pharynx; bilateral allergic shiners; bilateral TMs air fluid level clear; bilateral nasal turbinates edema/erythema; oropharynx macular erythema  Eyes: Pupils are equal, round, and reactive to light. Conjunctivae, EOM and lids are normal. Right eye exhibits no chemosis, no discharge, no exudate and no hordeolum. No foreign body present in the right  eye. Left eye exhibits no chemosis, no discharge, no exudate and no hordeolum. No foreign body present in the left eye. Right conjunctiva is not injected. Right conjunctiva  has no hemorrhage. Left conjunctiva is not injected. Left conjunctiva has no hemorrhage. No scleral icterus. Right eye exhibits normal extraocular motion and no nystagmus. Left eye exhibits normal extraocular motion and no nystagmus. Right pupil is round and reactive. Left pupil is round and reactive. Pupils are equal.  Neck: Trachea normal, normal range of motion and phonation normal. Neck supple. No tracheal tenderness, no spinous process tenderness and no muscular tenderness present. No neck rigidity. No tracheal deviation, no edema, no erythema and normal range of motion present. No thyroid mass and no thyromegaly present.  Cardiovascular: Normal rate, regular rhythm, S1 normal, S2 normal, normal heart sounds and intact distal pulses.  PMI is not displaced.  Exam reveals no gallop and no friction rub.   No murmur heard. Pulses:      Radial pulses are 2+ on the right side, and 2+ on the left side.  Pulmonary/Chest: Effort normal and breath sounds normal. No accessory muscle usage or stridor. No respiratory distress. She has no decreased breath sounds. She has no wheezes. She has no rhonchi. She has no rales. She exhibits no tenderness.  Abdominal: Normal appearance. She exhibits no distension, no fluid wave and no ascites. There is no rigidity and no guarding.  Musculoskeletal: Normal range of motion. She exhibits no edema, tenderness or deformity.       Right shoulder: Normal.       Left shoulder: Normal.       Right elbow: Normal.      Left elbow: Normal.       Right hip: Normal.       Left hip: Normal.       Right knee: Normal.       Left knee: Normal.       Cervical back: Normal.       Thoracic back: Normal.       Lumbar back: Normal.       Right hand: Normal.       Left hand: Normal.  Lymphadenopathy:       Head  (right side): No submental, no submandibular, no tonsillar, no preauricular, no posterior auricular and no occipital adenopathy present.       Head (left side): No submental, no submandibular, no tonsillar, no preauricular, no posterior auricular and no occipital adenopathy present.    She has no cervical adenopathy.       Right cervical: No superficial cervical, no deep cervical and no posterior cervical adenopathy present.      Left cervical: No superficial cervical, no deep cervical and no posterior cervical adenopathy present.  Neurological: She is alert and oriented to person, place, and time. She has normal strength. She is not disoriented. She displays no atrophy and no tremor. No cranial nerve deficit or sensory deficit. She exhibits normal muscle tone. She displays no seizure activity. Coordination and gait normal. GCS eye subscore is 4. GCS verbal subscore is 5. GCS motor subscore is 6.  Bilateral hand grasp equal bilaterally 5/5; on/off exam table; in/out of chair without difficulty; gait sure and steady in hall  Skin: Skin is warm, dry and intact. Bruising and rash noted. No abrasion, no burn, no ecchymosis, no laceration, no lesion, no petechiae and no purpura noted. Rash is papular. Rash is not macular, not maculopapular, not nodular, not pustular, not vesicular and not urticarial. She is not diaphoretic. No cyanosis or erythema. No pallor. Nails show no clubbing.     Psychiatric: She has a normal mood and affect. Her speech  is normal and behavior is normal. Judgment and thought content normal. Cognition and memory are normal.  Nursing note and vitals reviewed.    A few scattered 0.29mm papules grouped right anterior throat; legs and arms less than 2cm diameter; bruising nummular upper thighs x 2 noted by patient no known injury; itchy     Assessment & Plan:  A-pruritic rash  P-Labs recently normal; one coworker with similar symptoms seen today  Start zyrtec 10mg  po BID prn itching  or zyrtec 10mg  po qam and benadryl 25mg  po qhs prn itching OTC.  Ice 15 minutes topical prn itching.  Given hydrocortisone UD 1% cream may apply BID prn itching/rash 4 UD from clinic stock and Given benadryl gel 2% may apply BID affected areas prn itching while at work 4 UD packets given to patient from clinic stock.  Discussed emollient application after showering avoid hot steamy showers hygiene products with perfumes. ER if wheezing/tongue swelling/dysphagia/SOB.  Medication as directed.  Symptomatic therapy suggested.  Warm to cool water soaks and/or oatmeal baths.  Call or return to clinic as needed if these symptoms worsen or fail to improve as anticipated.  Exitcare handout on contact dermatitis given to patient.  Patient verbalized agreement and understanding of treatment plan and had no further questions at this time.   P2:  Avoidance and hand washing.

## 2016-09-25 NOTE — Patient Instructions (Signed)

## 2016-10-06 ENCOUNTER — Other Ambulatory Visit: Payer: Self-pay | Admitting: Family Medicine

## 2016-10-06 DIAGNOSIS — Z1231 Encounter for screening mammogram for malignant neoplasm of breast: Secondary | ICD-10-CM

## 2016-10-27 ENCOUNTER — Telehealth: Payer: Self-pay

## 2016-10-27 ENCOUNTER — Ambulatory Visit (INDEPENDENT_AMBULATORY_CARE_PROVIDER_SITE_OTHER): Payer: No Typology Code available for payment source | Admitting: Family Medicine

## 2016-10-27 ENCOUNTER — Encounter: Payer: Self-pay | Admitting: Family Medicine

## 2016-10-27 VITALS — BP 120/68 | HR 79 | Temp 98.2°F | Wt 119.5 lb

## 2016-10-27 DIAGNOSIS — E041 Nontoxic single thyroid nodule: Secondary | ICD-10-CM | POA: Diagnosis not present

## 2016-10-27 DIAGNOSIS — R9431 Abnormal electrocardiogram [ECG] [EKG]: Secondary | ICD-10-CM | POA: Diagnosis not present

## 2016-10-27 DIAGNOSIS — R079 Chest pain, unspecified: Secondary | ICD-10-CM | POA: Diagnosis not present

## 2016-10-27 DIAGNOSIS — M79601 Pain in right arm: Secondary | ICD-10-CM | POA: Diagnosis not present

## 2016-10-27 MED ORDER — PREDNISONE 20 MG PO TABS: ORAL_TABLET | ORAL | 0 refills | Status: DC

## 2016-10-27 MED ORDER — METHOCARBAMOL 500 MG PO TABS: 500.0000 mg | ORAL_TABLET | Freq: Three times a day (TID) | ORAL | 0 refills | Status: DC | PRN

## 2016-10-27 NOTE — Assessment & Plan Note (Signed)
EKG largely unchanged

## 2016-10-27 NOTE — Telephone Encounter (Signed)
seeing today.

## 2016-10-27 NOTE — Telephone Encounter (Signed)
Pt walked in with chest pain and rt arm pain; pain level 7 now; pt has had pain on and off for 1 year and worse since 10/26/16.today also nauseated and h/a. No dizziness or SOB. BP 120/68. Pt to see Dr Darnell Level now.

## 2016-10-27 NOTE — Assessment & Plan Note (Signed)
Endorses some R arm/hand paresthesias and numbness, however story and exam more consistent with muscular cause. She has evidence of rhomboid strain/spasm, and RTC and biceps tendonitis. Concern for developing frozen shoulder. No shoulder bursitis noted today. Not consistent with cervical radiculopathy today.  Treat with prednisone course, heating pad, and robaxin muscle relaxant. Update if not improving with treatment, consider imaging (shoulder).

## 2016-10-27 NOTE — Progress Notes (Signed)
BP 120/68 (BP Location: Left Arm, Patient Position: Sitting, Cuff Size: Normal)   Pulse 79   Temp 98.2 F (36.8 C) (Oral)   Wt 119 lb 8 oz (54.2 kg)   LMP 09/17/2016   BMI 21.51 kg/m    CC: chest pain Subjective:    Patient ID: Denise Gordon, female    DOB: 10-26-1970, 46 y.o.   MRN: [id number]  HPI: Denise Gordon is a 46 y.o. female presenting on 10/27/2016 for Chest Pain (Intermittent for 1 yr. Also, c/o left arm pain that started about 2 mos ago. Tingling in right hand)   Walk in patient. 2 mo h/o recurrent intermittent upper chest pain, now associated with R arm pain starting from R shoulder blade radiation down arm to hands, with associated numbness and tingling down hand. Worsening headaches (migraines) and neck pain, with nausea. No dyspnea, fevers/chills, no active synovitis.  Denies inciting trauma or injury.  Hasn't tried anything other than tylenol. She also used valium which was somewhat helpful.   Seen last year by Dr Nehemiah Massed then Dr Yvone Neu for ongoing chest pain - she did have abnormal EKG but reassuring further evaluation including echocardiogram (mild valvular regurg), normal stress echo and normal coronary CTA (calcium score of zero). CT chest was also reassuring. Previously no improvement with imdur 15mg  daily. Valium did help some.   CT showed calcifications in hilar regions and mediastinum (old granulomatous disease) and 21mm nodule along L major fissure likely incidental finding.   Known L 0.7cm thyroid nodule seen on [place] 07/2015. Ongoing intermittent choking sensation and cough.   LMP 09/17/2016.  S/p BTL  Relevant past medical, surgical, family and social history reviewed and updated as indicated. Interim medical history since our last visit reviewed. Allergies and medications reviewed and updated. Outpatient Medications Prior to Visit  Medication Sig Dispense Refill  . acetaminophen (TYLENOL) 500 MG tablet Take 1,000 mg by mouth every 6 (six) hours as needed for  mild pain or moderate pain.     . cetirizine (ZYRTEC) 10 MG tablet Take 1 tablet (10 mg total) by mouth daily. 30 tablet 11  . diazepam (VALIUM) 5 MG tablet Take 1 tablet (5 mg total) by mouth every 8 (eight) hours as needed for muscle spasms. 20 tablet 0  . Multiple Vitamin (MULTIVITAMIN) capsule Take 1 capsule by mouth daily.    . rizatriptan (MAXALT) 5 MG tablet Take 1 tablet (5 mg total) by mouth as needed for migraine. May repeat in 2 hours if needed 10 tablet 3  . vitamin B-12 (CYANOCOBALAMIN) 1000 MCG tablet Take 1 tablet (1,000 mcg total) by mouth daily.     No facility-administered medications prior to visit.      Per HPI unless specifically indicated in ROS section below Review of Systems     Objective:    BP 120/68 (BP Location: Left Arm, Patient Position: Sitting, Cuff Size: Normal)   Pulse 79   Temp 98.2 F (36.8 C) (Oral)   Wt 119 lb 8 oz (54.2 kg)   LMP 09/17/2016   BMI 21.51 kg/m   Wt Readings from Last 3 Encounters:  10/27/16 119 lb 8 oz (54.2 kg)  08/07/16 124 lb (56.2 kg)  11/15/15 124 lb 12 oz (56.6 kg)    Physical Exam  Constitutional: She is oriented to person, place, and time. She appears well-developed and well-nourished. No distress.  HENT:  Mouth/Throat: Oropharynx is clear and moist. No oropharyngeal exudate.  Neck: No thyromegaly (L thyroid nodule  palpable) present.  Cardiovascular: Normal rate, regular rhythm, normal heart sounds and intact distal pulses.   No murmur heard. Pulmonary/Chest: Effort normal and breath sounds normal. No respiratory distress. She has no wheezes. She has no rales.  Musculoskeletal: She exhibits no edema.  FROM at neck  R shoulder WNL L Shoulder exam: No deformity of shoulders on inspection. + tenderness to palpation of R rhomboids and trapezius, as well as lateral shoulder and upper arm.  FROM in abduction  Limited ROM active forward flexion, passive movement better tolerated + pain/weakness with testing SITS in  ext/int rotation. + pain with empty can sign. + speed test. No impingement. No pain with crossover test. No pain with rotation of humeral head in [place] joint.  Very tight shoulder muscles  Neurological: She is alert and oriented to person, place, and time. No sensory deficit.  Neg spurling  Skin: Skin is warm and dry. No rash noted. No erythema.  Nursing note and vitals reviewed.   EKG - NSR rate 75, normal axis, intervals, T wave inversion inferiorly, delayed R wave progression. overall unchanged from prior.     Assessment & Plan:   Problem List Items Addressed This Visit    Left thyroid nodule   Relevant Orders   US THYROID   Nonspecific abnormal electrocardiogram (ECG) (EKG)    EKG largely unchanged      Right arm pain - Primary    Endorses some R arm/hand paresthesias and numbness, however story and exam more consistent with muscular cause. She has evidence of rhomboid strain/spasm, and RTC and biceps tendonitis. Concern for developing frozen shoulder. No shoulder bursitis noted today. Not consistent with cervical radiculopathy today.  Treat with prednisone course, heating pad, and robaxin muscle relaxant. Update if not improving with treatment, consider imaging (shoulder).        Other Visit Diagnoses    Chest pain, unspecified type       Relevant Orders   EKG 12-Lead (Completed)       Follow up plan: Return if symptoms worsen or fail to improve.  Ria Bush, MD

## 2016-10-27 NOTE — Patient Instructions (Addendum)
Voy a ordenar un sonograma de tiroides.  Creo que tiene Marketing executive de sobreuso - tratamiento con relajante de musculo y curso de prednisona.  Dejeme saber si no mejora con esto.

## 2016-10-30 ENCOUNTER — Ambulatory Visit
Admission: RE | Admit: 2016-10-30 | Discharge: 2016-10-30 | Disposition: A | Discharge status: Home / Self Care | Payer: No Typology Code available for payment source | Source: Ambulatory Visit | Attending: Family Medicine | Admitting: Family Medicine

## 2016-10-30 DIAGNOSIS — E041 Nontoxic single thyroid nodule: Secondary | ICD-10-CM | POA: Diagnosis present

## 2016-11-11 ENCOUNTER — Ambulatory Visit
Admission: RE | Admit: 2016-11-11 | Discharge: 2016-11-11 | Disposition: A | Discharge status: Home / Self Care | Payer: No Typology Code available for payment source | Source: Ambulatory Visit | Attending: Family Medicine | Admitting: Family Medicine

## 2016-11-11 DIAGNOSIS — Z1231 Encounter for screening mammogram for malignant neoplasm of breast: Secondary | ICD-10-CM | POA: Insufficient documentation

## 2016-11-12 LAB — HM MAMMOGRAPHY

## 2016-11-14 ENCOUNTER — Encounter: Payer: Self-pay | Admitting: Family Medicine

## 2016-12-08 ENCOUNTER — Ambulatory Visit
Admission: RE | Admit: 2016-12-08 | Discharge: 2016-12-08 | Disposition: A | Discharge status: Home / Self Care | Payer: No Typology Code available for payment source | Source: Ambulatory Visit | Attending: Family Medicine | Admitting: Family Medicine

## 2016-12-08 ENCOUNTER — Telehealth: Payer: Self-pay | Admitting: Family Medicine

## 2016-12-08 DIAGNOSIS — M542 Cervicalgia: Secondary | ICD-10-CM

## 2016-12-08 DIAGNOSIS — M79601 Pain in right arm: Secondary | ICD-10-CM

## 2016-12-08 NOTE — Telephone Encounter (Signed)
Seen here last month thought shoulder > neck source treated with prednisone and robaxin. How did prednisone help?  If ongoing pain, offer come in for R shoulder and cervical spine films. Ordered.

## 2016-12-08 NOTE — Telephone Encounter (Signed)
Copied from [place]. Topic: Quick Communication - See Telephone Encounter >> Dec 08, 2016  2:07 PM [name] wrote: CRM for notification. See Telephone encounter for:  Pt needing Dr. Darnell Level nurse to call. Pt saw dr Darnell Level and he prescribed her medicine for her back and the medicine isnt working. Pt wanting to know about having xrays done.  12/08/16.

## 2016-12-08 NOTE — Telephone Encounter (Signed)
Left message on vm per dpr relaying message per Dr. Darnell Level. Asked pt to let Dr. Danise Mina know if the prednisone helped. If not, the x-ray order is in so she can come in 9:00 AM-12:30 PM or 2:30 PM - 4:00 PM to have done.

## 2016-12-08 NOTE — Telephone Encounter (Signed)
Pt last seen 10/27/16.Please advise.

## 2017-03-19 ENCOUNTER — Ambulatory Visit: Payer: Self-pay | Admitting: Registered Nurse

## 2017-03-19 VITALS — BP 123/74 | HR 81 | Temp 99.4°F

## 2017-03-19 DIAGNOSIS — J301 Allergic rhinitis due to pollen: Secondary | ICD-10-CM

## 2017-03-19 DIAGNOSIS — J209 Acute bronchitis, unspecified: Secondary | ICD-10-CM

## 2017-03-19 DIAGNOSIS — J0101 Acute recurrent maxillary sinusitis: Secondary | ICD-10-CM

## 2017-03-19 MED ORDER — LORATADINE 10 MG PO TABS: 10.0000 mg | ORAL_TABLET | Freq: Every day | ORAL | 11 refills | Status: DC

## 2017-03-19 MED ORDER — AMOXICILLIN-POT CLAVULANATE 875-125 MG PO TABS: 1.0000 | ORAL_TABLET | Freq: Two times a day (BID) | ORAL | 0 refills | Status: AC

## 2017-03-19 MED ORDER — ACETAMINOPHEN 500 MG PO TABS: 1000.0000 mg | ORAL_TABLET | Freq: Four times a day (QID) | ORAL | 0 refills | Status: DC | PRN

## 2017-03-19 MED ORDER — BENZONATATE 200 MG PO CAPS: 200.0000 mg | ORAL_CAPSULE | Freq: Three times a day (TID) | ORAL | 0 refills | Status: AC | PRN

## 2017-03-19 MED ORDER — FLUTICASONE PROPIONATE 50 MCG/ACT NA SUSP: 1.0000 | Freq: Two times a day (BID) | NASAL | 6 refills | Status: DC

## 2017-03-19 MED ORDER — SALINE SPRAY 0.65 % NA SOLN: 2.0000 | NASAL | 0 refills | Status: DC

## 2017-03-19 NOTE — Patient Instructions (Addendum)
Sinus Rinse What is a sinus rinse? A sinus rinse is a simple home treatment that is used to rinse your sinuses with a sterile mixture of salt and water (saline solution). Sinuses are air-filled spaces in your skull behind the bones of your face and forehead that open into your nasal cavity. You will use the following:  Saline solution.  Neti pot or spray bottle. This releases the saline solution into your nose and through your sinuses. Neti pots and spray bottles can be purchased at [employer], a health food store, or online.  When would I do a sinus rinse? A sinus rinse can help to clear mucus, dirt, dust, or pollen from the nasal cavity. You may do a sinus rinse when you have a cold, a virus, nasal allergy symptoms, a sinus infection, or stuffiness in the nose or sinuses. If you are considering a sinus rinse:  Ask your child's health care provider before performing a sinus rinse on your child.  Do not do a sinus rinse if you have had ear or nasal surgery, ear infection, or blocked ears.  How do I do a sinus rinse?  Wash your hands.  Disinfect your device according to the directions provided and then dry it.  Use the solution that comes with your device or one that is sold separately in stores. Follow the mixing directions on the package.  Fill your device with the amount of saline solution as directed by the device instructions.  Stand over a sink and tilt your head sideways over the sink.  Place the spout of the device in your upper nostril (the one closer to the ceiling).  Gently pour or squeeze the saline solution into the nasal cavity. The liquid should drain to the lower nostril if you are not overly congested.  Gently blow your nose. Blowing too hard may cause ear pain.  Repeat in the other nostril.  Clean and rinse your device with clean water and then air-dry it. Are there risks of a sinus rinse? Sinus rinse is generally very safe and effective. However,  there are a few risks, which include:  A burning sensation in the sinuses. This may happen if you do not make the saline solution as directed. Make sure to follow all directions when making the saline solution.  Infection from contaminated water. This is rare, but possible.  Nasal irritation.  This information is not intended to replace advice given to you by your health care provider. Make sure you discuss any questions you have with your health care provider. Document Released: 07/27/2013 Document Revised: 11/27/2015 Document Reviewed: 05/17/2013 Elsevier Interactive Patient Education  2017 [employer] Sinusitis, Adult Sinusitis is soreness and inflammation of your sinuses. Sinuses are hollow spaces in the bones around your face. Your sinuses are located:  Around your eyes.  In the middle of your forehead.  Behind your nose.  In your cheekbones.  Your sinuses and nasal passages are lined with a stringy fluid (mucus). Mucus normally drains out of your sinuses. When your nasal tissues become inflamed or swollen, the mucus can become trapped or blocked so air cannot flow through your sinuses. This allows bacteria, viruses, and funguses to grow, which leads to infection. Sinusitis can develop quickly and last for 7?10 days (acute) or for more than 12 weeks (chronic). Sinusitis often develops after a cold. What are the causes? This condition is caused by anything that creates swelling in the sinuses or stops mucus from draining, including:  Allergies.  Asthma.  Bacterial or viral infection.  Abnormally shaped bones between the nasal passages.  Nasal growths that contain mucus (nasal polyps).  Narrow sinus openings.  Pollutants, such as chemicals or irritants in the air.  A foreign object stuck in the nose.  A fungal infection. This is rare.  What increases the risk? The following factors may make you more likely to develop this condition:  Having allergies or  asthma.  Having had a recent cold or respiratory tract infection.  Having structural deformities or blockages in your nose or sinuses.  Having a weak immune system.  Doing a lot of swimming or diving.  Overusing nasal sprays.  Smoking.  What are the signs or symptoms? The main symptoms of this condition are pain and a feeling of pressure around the affected sinuses. Other symptoms include:  Upper toothache.  Earache.  Headache.  Bad breath.  Decreased sense of smell and taste.  A cough that may get worse at night.  Fatigue.  Fever.  Thick drainage from your nose. The drainage is often green and it may contain pus (purulent).  Stuffy nose or congestion.  Postnasal drip. This is when extra mucus collects in the throat or back of the nose.  Swelling and warmth over the affected sinuses.  Sore throat.  Sensitivity to light.  How is this diagnosed? This condition is diagnosed based on symptoms, a medical history, and a physical exam. To find out if your condition is acute or chronic, your health care provider may:  Look in your nose for signs of nasal polyps.  Tap over the affected sinus to check for signs of infection.  View the inside of your sinuses using an imaging device that has a light attached (endoscope).  If your health care provider suspects that you have chronic sinusitis, you may also:  Be tested for allergies.  Have a sample of mucus taken from your nose (nasal culture) and checked for bacteria.  Have a mucus sample examined to see if your sinusitis is related to an allergy.  If your sinusitis does not respond to treatment and it lasts longer than 8 weeks, you may have an MRI or CT scan to check your sinuses. These scans also help to determine how severe your infection is. In rare cases, a bone biopsy may be done to rule out more serious types of fungal sinus disease. How is this treated? Treatment for sinusitis depends on the cause and  whether your condition is chronic or acute. If a virus is causing your sinusitis, your symptoms will go away on their own within 10 days. You may be given medicines to relieve your symptoms, including:  Topical nasal decongestants. They shrink swollen nasal passages and let mucus drain from your sinuses.  Antihistamines. These drugs block inflammation that is triggered by allergies. This can help to ease swelling in your nose and sinuses.  Topical nasal corticosteroids. These are nasal sprays that ease inflammation and swelling in your nose and sinuses.  Nasal saline washes. These rinses can help to get rid of thick mucus in your nose.  If your condition is caused by bacteria, you will be given an antibiotic medicine. If your condition is caused by a fungus, you will be given an antifungal medicine. Surgery may be needed to correct underlying conditions, such as narrow nasal passages. Surgery may also be needed to remove polyps. Follow these instructions at home: Medicines  Take, use, or apply over-the-counter and prescription medicines only as told by  your health care provider. These may include nasal sprays.  If you were prescribed an antibiotic medicine, take it as told by your health care provider. Do not stop taking the antibiotic even if you start to feel better. Hydrate and Humidify  Drink enough water to keep your urine clear or pale yellow. Staying hydrated will help to thin your mucus.  Use a cool mist humidifier to keep the humidity level in your home above 50%.  Inhale steam for 10-15 minutes, 3-4 times a day or as told by your health care provider. You can do this in the bathroom while a hot shower is running.  Limit your exposure to cool or dry air. Rest  Rest as much as possible.  Sleep with your head raised (elevated).  Make sure to get enough sleep each night. General instructions  Apply a warm, moist washcloth to your face 3-4 times a day or as told by your  health care provider. This will help with discomfort.  Wash your hands often with soap and water to reduce your exposure to viruses and other germs. If soap and water are not available, use hand sanitizer.  Do not smoke. Avoid being around people who are smoking (secondhand smoke).  Keep all follow-up visits as told by your health care provider. This is important. Contact a health care provider if:  You have a fever.  Your symptoms get worse.  Your symptoms do not improve within 10 days. Get help right away if:  You have a severe headache.  You have persistent vomiting.  You have pain or swelling around your face or eyes.  You have vision problems.  You develop confusion.  Your neck is stiff.  You have trouble breathing. This information is not intended to replace advice given to you by your health care provider. Make sure you discuss any questions you have with your health care provider. Document Released: 12/30/2004 Document Revised: 08/26/2015 Document Reviewed: 10/25/2014 Elsevier Interactive Patient Education  2018 [employer].  Acute Bronchitis, Adult Acute bronchitis is sudden (acute) swelling of the air tubes (bronchi) in the lungs. Acute bronchitis causes these tubes to fill with mucus, which can make it hard to breathe. It can also cause coughing or wheezing. In adults, acute bronchitis usually goes away within 2 weeks. A cough caused by bronchitis may last up to 3 weeks. Smoking, allergies, and asthma can make the condition worse. Repeated episodes of bronchitis may cause further lung problems, such as chronic obstructive pulmonary disease (COPD). What are the causes? This condition can be caused by germs and by substances that irritate the lungs, including:  Cold and flu viruses. This condition is most often caused by the same virus that causes a cold.  Bacteria.  Exposure to tobacco smoke, dust, fumes, and air pollution.  What increases the risk? This  condition is more likely to develop in people who:  Have close contact with someone with acute bronchitis.  Are exposed to lung irritants, such as tobacco smoke, dust, fumes, and vapors.  Have a weak immune system.  Have a respiratory condition such as asthma.  What are the signs or symptoms? Symptoms of this condition include:  A cough.  Coughing up clear, yellow, or green mucus.  Wheezing.  Chest congestion.  Shortness of breath.  A fever.  Body aches.  Chills.  A sore throat.  How is this diagnosed? This condition is usually diagnosed with a physical exam. During the exam, your health care provider may order  tests, such as chest X-rays, to rule out other conditions. He or she may also:  Test a sample of your mucus for bacterial infection.  Check the level of oxygen in your blood. This is done to check for pneumonia.  Do a chest X-ray or lung function testing to rule out pneumonia and other conditions.  Perform blood tests.  Your health care provider will also ask about your symptoms and medical history. How is this treated? Most cases of acute bronchitis clear up over time without treatment. Your health care provider may recommend:  Drinking more fluids. Drinking more makes your mucus thinner, which may make it easier to breathe.  Taking a medicine for a fever or cough.  Taking an antibiotic medicine.  Using an inhaler to help improve shortness of breath and to control a cough.  Using a cool mist vaporizer or humidifier to make it easier to breathe.  Follow these instructions at home: Medicines  Take over-the-counter and prescription medicines only as told by your health care provider.  If you were prescribed an antibiotic, take it as told by your health care provider. Do not stop taking the antibiotic even if you start to feel better. General instructions  Get plenty of rest.  Drink enough fluids to keep your urine clear or pale yellow.  Avoid  smoking and secondhand smoke. Exposure to cigarette smoke or irritating chemicals will make bronchitis worse. If you smoke and you need help quitting, ask your health care provider. Quitting smoking will help your lungs heal faster.  Use an inhaler, cool mist vaporizer, or humidifier as told by your health care provider.  Keep all follow-up visits as told by your health care provider. This is important. How is this prevented? To lower your risk of getting this condition again:  Wash your hands often with soap and water. If soap and water are not available, use hand sanitizer.  Avoid contact with people who have cold symptoms.  Try not to touch your hands to your mouth, nose, or eyes.  Make sure to get the flu shot every year.  Contact a health care provider if:  Your symptoms do not improve in 2 weeks of treatment. Get help right away if:  You cough up blood.  You have chest pain.  You have severe shortness of breath.  You become dehydrated.  You faint or keep feeling like you are going to faint.  You keep vomiting.  You have a severe headache.  Your fever or chills gets worse. This information is not intended to replace advice given to you by your health care provider. Make sure you discuss any questions you have with your health care provider. Document Released: 02/07/2004 Document Revised: 07/25/2015 Document Reviewed: 06/20/2015 Elsevier Interactive Patient Education  2018 [employer]. Allergic Rhinitis, Adult Allergic rhinitis is an allergic reaction that affects the mucous membrane inside the nose. It causes sneezing, a runny or stuffy nose, and the feeling of mucus going down the back of the throat (postnasal drip). Allergic rhinitis can be mild to severe. There are two types of allergic rhinitis:  Seasonal. This type is also called hay fever. It happens only during certain seasons.  Perennial. This type can happen at any time of the year.  What are the  causes? This condition happens when the body's defense system (immune system) responds to certain harmless substances called allergens as though they were germs.  Seasonal allergic rhinitis is triggered by pollen, which can come from grasses, trees,  and weeds. Perennial allergic rhinitis may be caused by:  House dust mites.  Pet dander.  Mold spores.  What are the signs or symptoms? Symptoms of this condition include:  Sneezing.  Runny or stuffy nose (nasal congestion).  Postnasal drip.  Itchy nose.  Tearing of the eyes.  Trouble sleeping.  Daytime sleepiness.  How is this diagnosed? This condition may be diagnosed based on:  Your medical history.  A physical exam.  Tests to check for related conditions, such as: ? Asthma. ? Pink eye. ? Ear infection. ? Upper respiratory infection.  Tests to find out which allergens trigger your symptoms. These may include skin or blood tests.  How is this treated? There is no cure for this condition, but treatment can help control symptoms. Treatment may include:  Taking medicines that block allergy symptoms, such as antihistamines. Medicine may be given as a shot, nasal spray, or pill.  Avoiding the allergen.  Desensitization. This treatment involves getting ongoing shots until your body becomes less sensitive to the allergen. This treatment may be done if other treatments do not help.  If taking medicine and avoiding the allergen does not work, new, stronger medicines may be prescribed.  Follow these instructions at home:  Find out what you are allergic to. Common allergens include smoke, dust, and pollen.  Avoid the things you are allergic to. These are some things you can do to help avoid allergens: ? Replace carpet with wood, tile, or vinyl flooring. Carpet can trap dander and dust. ? Do not smoke. Do not allow smoking in your home. ? Change your heating and air conditioning filter at least once a month. ? During  allergy season:  Keep windows closed as much as possible.  Plan outdoor activities when pollen counts are lowest. This is usually during the evening hours.  When coming indoors, change clothing and shower before sitting on furniture or bedding.  Take over-the-counter and prescription medicines only as told by your health care provider.  Keep all follow-up visits as told by your health care provider. This is important. Contact a health care provider if:  You have a fever.  You develop a persistent cough.  You make whistling sounds when you breathe (you wheeze).  Your symptoms interfere with your normal daily activities. Get help right away if:  You have shortness of breath. Summary  This condition can be managed by taking medicines as directed and avoiding allergens.  Contact your health care provider if you develop a persistent cough or fever.  During allergy season, keep windows closed as much as possible. This information is not intended to replace advice given to you by your health care provider. Make sure you discuss any questions you have with your health care provider. Document Released: 09/24/2000 Document Revised: 02/07/2016 Document Reviewed: 02/07/2016 Elsevier Interactive Patient Education  2018 [employer]. Nonallergic Rhinitis  1. Nonallergic rhinitis is a term used by allergist to describe inflammation in the nose that is not due to an allergic source (i.e., pollens, mold, animal dander or dust mites). 2. Nonallergic rhinitis can mimic many of the symptoms caused by allergies.  This includes a runny nose ("rhinorrhea"), sneezing, congestion, ear fullness and post nasal drip. 3. These symptoms usually occur year round but can be made worse by many environmental factors including weather change, irritants such as cigarette smoke, fumes, perfumes, detergents and many others. These are not allergens, but are irritants, and do not cause the formation of antibodies like  true allergens.  For this reason most people with nonallergic rhinitis have negative skin tests. 4. Unfortunately, we have a poor understanding of what causes nonallergic rhinitis but certain factors such as deviated septum, sinusitis and nasal polyps may contribute to the symptoms.  Due to the poor understanding of what causes nonallergic rhinitis it cannot be cured and our treatment is only symptomatic to control symptoms. 5. Important factors in the successful treatment of nonallergic rhinitis include avoidance of the irritants that cause symptoms; for example, people who continue to smoke may never improve.  Continuous therapy works better than intermittent.  This may mean taking medications once daily or 3-4 times a day. 6. Helpful treatments include nasal saline lavage, nasal ipratropium (Atrovent), nasal steroids, and decongestants.  Pure antihistamines don't seem to work very well.  Immunotherapy (allergy shots) has no role in the treatment of nonallergic rhinitis.  Several medications may have to be tried before a good combination is found for you.  These medications, in general, are safe for long term use.  Tolerance may develop to the therapeutic effects of these medications: Frequently changing between several helpful medications can prevent this should it occur.

## 2017-03-19 NOTE — Progress Notes (Signed)
Subjective:    Patient ID: Denise Gordon, female    DOB: Jan 19, 1970, 47 y.o.   MRN: [id number]  47y/o hispanic female established Pt c/o "really bad cold" with runny nose, dry cough, pain in chest from coughing, pain in back of head, neck and upper back, and sore throat x1 week. Denies otalgia, fever/chills, sinus pain/pressure, nasal or chest congestion. Has used Theraflu, Robitussin, and Tylenol at home with mild relief. Took Ibuprofen 800mg  at 0830 this am from clinic stock with a little bit of relief of chest and back pain.   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  + sick contacts at work viral URI  Denied loss of bowel/bladder control, arm/leg weaikness, saddle paresthesias.        Review of Systems  Constitutional: Positive for fatigue. Negative for activity change, appetite change, chills, diaphoresis, fever and unexpected weight change.  HENT: Positive for congestion, postnasal drip and sore throat. Negative for dental problem, drooling, ear discharge, ear pain, facial swelling, hearing loss, mouth sores, nosebleeds, rhinorrhea, sinus pressure, sinus pain, sneezing, tinnitus, trouble swallowing and voice change.   Eyes: Negative for photophobia, pain, discharge, redness, itching and visual disturbance.  Respiratory: Positive for cough. Negative for choking, chest tightness, shortness of breath, wheezing and stridor.   Cardiovascular: Negative for chest pain, palpitations and leg swelling.  Gastrointestinal: Negative for abdominal distention, abdominal pain, blood in stool, constipation, diarrhea, nausea and vomiting.  Endocrine: Negative for cold intolerance and heat intolerance.  Genitourinary: Negative for difficulty urinating, dysuria and hematuria.  Musculoskeletal: Positive for back pain, myalgias and neck pain. Negative for arthralgias, gait problem, joint swelling and neck stiffness.  Skin: Negative for color change, pallor, rash and wound.   Allergic/Immunologic: Positive for environmental allergies. Negative for food allergies.  Neurological: Positive for headaches. Negative for dizziness, tremors, seizures, syncope, facial asymmetry, speech difficulty, weakness, light-headedness and numbness.  Hematological: Negative for adenopathy. Does not bruise/bleed easily.  Psychiatric/Behavioral: Negative for agitation, confusion and sleep disturbance.       Objective:   Physical Exam  Constitutional: She is oriented to person, place, and time. Vital signs are normal. She appears well-developed and well-nourished. She is active and cooperative.  Non-toxic appearance. She does not have a sickly appearance. She appears ill. No distress.  HENT:  Head: Normocephalic and atraumatic.  Right Ear: Hearing, external ear and ear canal normal. A middle ear effusion is present.  Left Ear: Hearing, external ear and ear canal normal. A middle ear effusion is present.  Nose: Mucosal edema and rhinorrhea present. No nose lacerations, sinus tenderness, nasal deformity, septal deviation or nasal septal hematoma. No epistaxis.  No foreign bodies. Right sinus exhibits maxillary sinus tenderness. Right sinus exhibits no frontal sinus tenderness. Left sinus exhibits maxillary sinus tenderness. Left sinus exhibits no frontal sinus tenderness.  Mouth/Throat: Uvula is midline and mucous membranes are normal. Mucous membranes are not pale, not dry and not cyanotic. She does not have dentures. No oral lesions. No trismus in the jaw. Normal dentition. No dental abscesses, uvula swelling, lacerations or dental caries. Posterior oropharyngeal edema and posterior oropharyngeal erythema present. No oropharyngeal exudate or tonsillar abscesses.  Maxillary sinuses bilaterally TTP; bilateral TMs air fluid level clear; bilateral nasal turbinates edema/erythema clear discharge; cobblestoning posterior pharynx; bilateral allergic shiners  Eyes: Conjunctivae, EOM and lids are  normal. Pupils are equal, round, and reactive to light. Right eye exhibits no chemosis, no discharge, no exudate and no hordeolum. No foreign body present in the  right eye. Left eye exhibits no chemosis, no discharge, no exudate and no hordeolum. No foreign body present in the left eye. Right conjunctiva is not injected. Right conjunctiva has no hemorrhage. Left conjunctiva is not injected. Left conjunctiva has no hemorrhage. No scleral icterus. Right eye exhibits normal extraocular motion and no nystagmus. Left eye exhibits normal extraocular motion and no nystagmus. Right pupil is round and reactive. Left pupil is round and reactive. Pupils are equal.  Neck: Trachea normal, normal range of motion and phonation normal. Neck supple. No tracheal tenderness and no muscular tenderness present. No neck rigidity. No tracheal deviation, no edema, no erythema and normal range of motion present. No thyroid mass and no thyromegaly present.  Cardiovascular: Normal rate, regular rhythm, S1 normal, S2 normal, normal heart sounds and intact distal pulses. PMI is not displaced. Exam reveals no gallop, no distant heart sounds and no friction rub.  No murmur heard. Pulmonary/Chest: Effort normal and breath sounds normal. No accessory muscle usage or stridor. No respiratory distress. She has no decreased breath sounds. She has no wheezes. She has no rhonchi. She has no rales. She exhibits no tenderness.  Rare nonproductive cough in exam room; spoke full sentences without difficulty  Abdominal: Soft. Normal appearance. She exhibits no distension, no fluid wave and no ascites. There is no rigidity and no guarding.  Musculoskeletal: Normal range of motion. She exhibits no edema or tenderness.       Right shoulder: Normal.       Left shoulder: Normal.       Right elbow: Normal.      Left elbow: Normal.       Right hip: Normal.       Left hip: Normal.       Right knee: Normal.       Left knee: Normal.       Cervical back:  Normal.       Thoracic back: Normal.       Lumbar back: Normal.       Right hand: Normal.       Left hand: Normal.  Lymphadenopathy:       Head (right side): No submental, no submandibular, no tonsillar, no preauricular, no posterior auricular and no occipital adenopathy present.       Head (left side): No submental, no submandibular, no tonsillar, no preauricular, no posterior auricular and no occipital adenopathy present.    She has no cervical adenopathy.       Right cervical: No superficial cervical, no deep cervical and no posterior cervical adenopathy present.      Left cervical: No superficial cervical, no deep cervical and no posterior cervical adenopathy present.  Neurological: She is alert and oriented to person, place, and time. She has normal strength. She is not disoriented. She displays no atrophy and no tremor. No cranial nerve deficit or sensory deficit. She exhibits normal muscle tone. She displays no seizure activity. Coordination and gait normal. GCS eye subscore is 4. GCS verbal subscore is 5. GCS motor subscore is 6.  On/off exam table/in/out of chair without difficulty; gait sure and steady in hallway  Skin: Skin is warm, dry and intact. No abrasion, no bruising, no burn, no ecchymosis, no laceration, no lesion, no petechiae and no rash noted. She is not diaphoretic. No cyanosis or erythema. No pallor. Nails show no clubbing.  Psychiatric: She has a normal mood and affect. Her speech is normal and behavior is normal. Judgment and thought content normal. Cognition and  memory are normal.  Nursing note and vitals reviewed.         Assessment & Plan:  A-acute maxillary sinusitis recurrent, acute bronchitis  P-Restart flonase 1 spray each nostril BID #1 RF6 electronic Rx given to patient, phenylephrine 10mg  po q6h prn rhinitis 6 UD given to patient, saline 2 sprays each nostril q2h wa prn congestion 1 bottle given to patient from clinic stock.  If no improvement with 48  hours of saline and flonase use start augmentin 875mg  po BID x 10 days #20 RF0 dispensed from [facility].  Denied personal or family history of ENT cancer.  Shower BID especially prior to bed. No evidence of systemic bacterial infection, non toxic and well hydrated.  I do not see where any further testing or imaging is necessary at this time.   I will suggest supportive care, rest, good hygiene and encourage the patient to take adequate fluids.  The patient is to return to clinic or EMERGENCY ROOM if symptoms worsen or change significantly.  Exitcare handout on sinusitis and sinus rinse given to patient.  Patient verbalized agreement and understanding of treatment plan and had no further questions at this time.   P2:  Hand washing and cover cough  Rx electronic tessalon pearles 200mg  po TID prn cough #30 RF0 to her pharmacy of choice.   Cough lozenges po q2h prn cough given 8 UD from clinic stock. Discussed post nasal drip probable cause mix viral and allergies.  Restart claritin 10mg  po daily OTC. Bronchitis simple, community acquired, may have started as viral (probably respiratory syncytial, parainfluenza, influenza, or adenovirus), but now evidence of acute purulent bronchitis with resultant bronchial edema and mucus formation.  Viruses are the most common cause of bronchial inflammation in otherwise healthy adults with acute bronchitis.  The appearance of sputum is not predictive of whether a bacterial infection is present.  Purulent sputum is most often caused by viral infections.  There are a small portion of those caused by non-viral agents being Mycoplama pneumonia.  Microscopic examination or C&S of sputum in the healthy adult with acute bronchitis is generally not helpful (usually negative or normal respiratory flora) other considerations being cough from upper respiratory tract infections, sinusitis or allergic syndromes (mild asthma or viral pneumonia).  Differential Diagnoses:  reactive airway disease  (asthma, allergic aspergillosis (eosinophilia), chronic bronchitis, respiratory infection (sinusitis, common cold, pneumonia), congestive heart failure, reflux esophagitis, bronchogenic tumor, aspiration syndromes and/or exposure to pulmonary irritants/smoke. Without high fever, severe dyspnea, lack of physical findings or other risk factors, I will hold on a chest radiograph and CBC at this time.  I discussed that approximately 50% of patients with acute bronchitis have a cough that lasts up to three weeks, and 25% for over a month.  Tylenol 500mg  one to two tablets every four to six hours as needed for fever or myalgias.  No aspirin. Exitcare handout on bronchitis and inhaler use given to patient.  ER if hemopthysis, SOB, worst chest pain of life.   Patient instructed to follow up in one week or sooner if symptoms worsen.  Patient verbalized agreement and understanding of treatment plan.  P2:  hand washing and cover cough  Patient may use normal saline nasal spray 2 sprays each nostril q2h wa as needed. flonase 5mcg 1 spray each nostril BID  Patient denied personal or family history of ENT cancer.  OTC antihistamine of choice claritin 10mg  po daily.  Avoid triggers if possible.  Shower prior to bedtime if exposed  to triggers.  If allergic dust/dust mites recommend mattress/pillow covers/encasements; washing linens, vacuuming, sweeping, dusting weekly.  Call or return to clinic as needed if these symptoms worsen or fail to improve as anticipated.   Exitcare handout on allergic rhinitis and sinus rinse given to patient.  Patient verbalized understanding of instructions, agreed with plan of care and had no further questions at this time.  P2:  Avoidance and hand washing.

## 2017-03-31 ENCOUNTER — Ambulatory Visit: Payer: Self-pay | Admitting: Registered Nurse

## 2017-03-31 ENCOUNTER — Encounter: Payer: Self-pay | Admitting: Registered Nurse

## 2017-03-31 ENCOUNTER — Ambulatory Visit: Payer: Self-pay | Admitting: Family Medicine

## 2017-03-31 VITALS — BP 108/72 | HR 64 | Temp 97.4°F

## 2017-03-31 DIAGNOSIS — M4724 Other spondylosis with radiculopathy, thoracic region: Secondary | ICD-10-CM

## 2017-03-31 MED ORDER — ACETAMINOPHEN 500 MG PO TABS: 1000.0000 mg | ORAL_TABLET | Freq: Four times a day (QID) | ORAL | 0 refills | Status: AC | PRN

## 2017-03-31 NOTE — Patient Instructions (Signed)
Mid-Back Strain Rehab Ask your health care provider which exercises are safe for you. Do exercises exactly as told by your health care provider and adjust them as directed. It is normal to feel mild stretching, pulling, tightness, or discomfort as you do these exercises, but you should stop right away if you feel sudden pain or your pain gets worse. Do not begin these exercises until told by your health care provider. Stretching and range of motion exercises This exercise warms up your muscles and joints and improves the movement and flexibility of your back and shoulders. This exercise also help to relieve pain. Exercise A: Chest and spine stretch  1. Lie down on your back on a firm surface. 2. Roll a towel or a small blanket so it is about 4 inches (10 cm) in diameter. 3. Put the towel lengthwise under the middle of your back so it is under your spine, but not under your shoulder blades. 4. To increase the stretch, you may put your hands behind your head and let your elbows fall to your sides. 5. Hold for ____5-10______ seconds. Repeat exercise ___3_______ times. Complete this exercise ____1______ times a day. Strengthening exercises These exercises build strength and endurance in your back and your shoulder blade muscles. Endurance is the ability to use your muscles for a long time, even after they get tired. Exercise B: Alternating arm and leg raises  1. Get on your hands and knees on a firm surface. If you are on a hard floor, you may want to use padding to cushion your knees, such as an exercise mat. 2. Line up your arms and legs. Your hands should be below your shoulders, and your knees should be below your hips. 3. Lift your left leg behind you. At the same time, raise your right arm and straighten it in front of you. ? Do not lift your leg higher than your hip. ? Do not lift your arm higher than your shoulder. ? Keep your abdominal and back muscles tight. ? Keep your hips facing the  ground. ? Do not arch your back. ? Keep your balance carefully, and do not hold your breath. 4. Hold for ___5_______ seconds. 5. Slowly return to the starting position and repeat with your right leg and your left arm. Repeat ____3______ times. Complete this exercise ___1_______ times a day. Exercise C: Straight arm rows ( shoulder extension) 1. Stand with your feet shoulder width apart. 2. Secure an exercise band to a stable object in front of you so the band is at or above shoulder height. 3. Hold one end of the exercise band in each hand. 4. Straighten your elbows and lift your hands up to shoulder height. 5. Step back, away from the secured end of the exercise band, until the band stretches. 6. Squeeze your shoulder blades together and pull your hands down to the sides of your thighs. Stop when your hands are straight down by your sides. Do not let your hands go behind your body. 7. Hold for ____5______ seconds. 8. Slowly return to the starting position. Repeat ____3______ times. Complete this exercise __once a day________ times a day. Exercise D: Shoulder external rotation, prone 1. Lie on your abdomen on a firm bed so your left / right forearm hangs over the edge of the bed and your upper arm is on the bed, straight out from your body. ? Your elbow should be bent. ? Your palm should be facing your feet. 2. If instructed, hold a  ____2lb______ weight in your hand. 3. Squeeze your shoulder blade toward the middle of your back. Do not let your shoulder lift toward your ear. 4. Keep your elbow bent in an "L" shape (90 degrees) while you slowly move your forearm up toward the ceiling. Move your forearm up to the height of the bed, toward your head. ? Your upper arm should not move. ? At the top of the movement, your palm should face the floor. 5. Hold for ____5______ seconds. 6. Slowly return to the starting position and relax your muscles. Repeat _____3_____ times. Complete this exercise  ___1_______ times a day. Exercise E: Scapular retraction and external rotation, rowing  1. Sit in a stable chair without armrests, or stand. 2. Secure an exercise band to a stable object in front of you so it is at shoulder height. 3. Hold one end of the exercise band in each hand. 4. Bring your arms out straight in front of you. 5. Step back, away from the secured end of the exercise band, until the band stretches. 6. Pull the band backward. As you do this, bend your elbows and squeeze your shoulder blades together, but avoid letting the rest of your body move. Do not let your shoulders lift up toward your ears. 7. Stop when your elbows are at your sides or slightly behind your body. 8. Hold for _____5_____ seconds. 9. Slowly straighten your arms to return to the starting position. Repeat ____3______ times. Complete this exercise _____1 Denise Gordon (Arthritis) El trmino artritis se [place] comnmente para hacer referencia al dolor de las articulaciones o a la enfermedad articular. Hay ms de 100tipos de artritis. CAUSAS La causa ms frecuente de esta afeccin es el desgaste de una articulacin. Algunas otras causas son las siguientes:  Gota.  Inflamacin de una articulacin.  Una infeccin de Insurance claims handler.  Esguinces y otras lesiones cerca de la articulacin.  Una reaccin farmacolgica o alrgica. En algunos casos, es posible que la causa no se conozca. SNTOMAS El sntoma principal de esta afeccin es el dolor de la articulacin con el movimiento. Otros sntomas pueden ser los siguientes:  Enrojecimiento, hinchazn o rigidez de Insurance claims handler.  Calor que emana de Water engineer.  Cristy Hilts.  Sensacin generalizada de estar enfermo. DIAGNSTICO Esta afeccin se puede diagnosticar mediante un examen fsico y Arvid Right ellos:  Anlisis de [place].  Anlisis de [place].  Estudios de diagnstico por imgenes, como una resonancia magntica (RM), radiografas o una  tomografa computarizada (TC). A veces, se extrae lquido de una articulacin para analizarlo. TRATAMIENTO El tratamiento de esta afeccin puede incluir lo siguiente:  El tratamiento de la causa, si se conoce.  Reposo.  Mantener elevada la articulacin.  Aplicar compresas fras o calientes en la articulacin.  Medicamentos para [employer] sntomas y Risk manager.  Inyecciones de un corticoide, como cortisona, en la articulacin para ayudar a Best boy y Risk manager. Segn la causa de la artritis, tal vez haya que hacer cambios en el estilo de vida para reducir la carga sobre la articulacin. Algunos de los cambios incluyen realizar ms actividad fsica y Sports coach de [place] los medicamentos de venta libre y los recetados solamente como se lo haya indicado el mdico.  No tome aspirina para Theatre stage manager dolor si se sospecha la presencia de [place]. Actividades  Ponga en reposo la articulacin como se lo haya indicado el mdico. El reposo es importante cuando  la enfermedad est activa y la articulacin le duele, est hinchada o rgida.  Evite las actividades que intensifiquen Conservation officer, historic buildings. Es importante Paramedic equilibrio entre la actividad y el reposo.  Con frecuencia, realice ejercicios de flexibilidad articular como se lo haya indicado el mdico. Intente realizar ejercicios de bajo impacto, por ejemplo: ? Natacin. ? Gimnasia acutica. ? Andar en bicicleta. ? Caminar. Cuidado de la articulacin  Si la articulacin se le hincha, mantngala elevada como se lo haya indicado el mdico.  Si al despertar por la maana, nota que la articulacin est rgida, intente tomar una ducha con agua tibia.  Si se lo indican, pngase calor en la articulacin. Si es diabtico, no se aplique calor sin la autorizacin del mdico. ? Coloque una toalla entre la articulacin y la compresa caliente o la  almohadilla trmica. ? Coloque el calor en la zona durante 20 o 19minutos.  Si se lo indican, pngase hielo en la articulacin: ? Ponga el hielo en una bolsa plstica. ? Coloque una [employer] piel y la bolsa de hielo. ? Coloque el hielo durante 46minutos, 2 a 3veces por da.  Concurra a todas las visitas de control como se lo haya indicado el mdico. Esto es importante. SOLICITE ATENCIN MDICA SI:  El dolor empeora.  Tiene fiebre. SOLICITE ATENCIN MDICA DE INMEDIATO SI:  Siente dolor, u observa hinchazn o enrojecimiento en la articulacin.  Siente dolor en muchas articulaciones y se le hinchan.  Siente un dolor intenso en la espalda.  Tiene mucha debilidad en la pierna.  No puede controlar los intestinos o la vejiga. Esta informacin no tiene Marine scientist el consejo del mdico. Asegrese de hacerle al mdico cualquier pregunta que tenga. Document Released: 12/30/2004 Document Revised: 04/23/2015 Document Reviewed: 03/27/2014 Elsevier Interactive Patient Education  2018 [place].  Mid-Back Strain With Rehab Mid-Back Strain Rehab Consulte al mdico qu ejercicios son seguros para usted. Haga los ejercicios exactamente como se lo haya indicado el mdico y gradelos como se lo hayan indicado. Es normal sentir un leve estiramiento, tirn, rigidez o molestia cuando haga estos ejercicios, pero debe detenerse de inmediato si siento un dolor repentino o si el dolor empeora. No comience a hacer estos ejercicios hasta que se lo indique el mdico. EJERCICIOS DE [employer] Y AMPLITUD DE MOVIMIENTOS Este ejercicio calienta los msculos y las articulaciones, y mejora la movilidad y la flexibilidad de la espalda y los hombros. Adems, ayuda a Best boy. Ejercicio A: Estiramiento de pecho y columna Acustese boca arriba sobre una superficie firme. Use una toalla o una manta pequea para armar un rollo de 4 pulgadas (10 cm) de dimetro. Coloque la toalla a lo largo,  debajo de la parte media de la espalda de modo que quede debajo de la columna, pero no debajo de los omplatos. Para aumentar el estiramiento, puede colocar las manos debajo de la cabeza y dejar que los codos caigan a los costados. Mantenga esta posicin durante __________ segundos.  Repita el ejercicio __________ veces. Realice este ejercicio __________ veces al da. EJERCICIOS DE FORTALECIMIENTO Estos ejercicios fortalecen los msculos de la espalda y los omplatos, y les otorgan resistencia. La resistencia es la capacidad de usar los msculos durante un tiempo prolongado, incluso despus de que se cansen. Ejercicio B: Elevaciones alternadas de pierna y brazo Perkins manos y las rodillas sobre una superficie firme. Si se colocar sobre una superficie muy dura, puede usar un elemento acolchado para apoyar las rodillas,  como una alfombrilla para ejercicios. Alinee los brazos y las piernas. Las manos deben estar debajo de los hombros y las rodillas debajo de la cadera. Eleve la pierna [employer]. Al mismo tiempo, eleve el brazo derecho y Engineer, petroleum frente a usted. No eleve la pierna por encima de la cadera. No eleve el brazo por encima del hombro. Mantenga los msculos del abdomen y de la espalda contrados. Mantenga la cadera mirando hacia el suelo. No arquee la espalda. Mantenga el equilibrio con cuidado y no contenga la respiracin. Mantenga esta posicin durante __________ segundos. Lentamente regrese a la posicin inicial y repita el ejercicio con la pierna derecha y el brazo izquierdo.  Repita __________ veces. Realice este ejercicio __________ veces al da. Ejercicio C: Remar con los brazos extendidos (extensin de hombros) Prese con los pies separados a la distancia de los hombros. Ate una banda para ejercicios a un objeto estable que est frente a usted, de modo que la banda est por encima de la altura del hombro o en el mismo nivel. Sostenga un extremo de  la banda para ejercicios en cada mano. Extienda los codos y [employer] a la altura de los hombros. Camine hacia atrs, alejndose del extremo fijo de la banda para ejercicios, hasta que [place] se tense. Junte los omplatos y [facility] costados de los muslos. Detngase cuando las manos estn en la misma posicin en ambos costados. No deje que las manos vayan hacia atrs del cuerpo. Mantenga esta posicin durante __________ segundos. Vuelva lentamente a la posicin inicial.  Repita __________ veces. Realice este ejercicio __________ veces al da. Ejercicio D: Rotacin externa con el hombro, en decbito prono Acustese boca abajo en una cama firme de modo que el antebrazo izquierdo/derecho cuelgue sobre el borde de la cama y la parte superior del brazo quede sobre la cama, extendido lejos del cuerpo. El codo debe estar flexionado. La palma debe [employer]. Si se lo indican, sostenga una pesa de __________ Adin Hector mano. Contraiga los omplatos hacia el centro de la espalda. No levante el hombro hacia la [place]. Mantenga el codo flexionado en forma de "L" (34 grados) mientras mueve el antebrazo hacia el techo lentamente. Mueva el antebrazo hasta la altura de la cama en direccin a la cabeza. No mueva la parte superior del brazo. En el final del movimiento, la palma debe apuntar hacia el suelo. Mantenga esta posicin durante __________ segundos. Vuelva lentamente a la posicin inicial y relaje los msculos.  Repita __________ veces. Realice este ejercicio __________ veces al da. Ejercicio E: Retraccin escapular y rotacin externa, remo Sintese en una silla estable que no tenga apoyabrazos o pngase de pie. Ate una banda para ejercicios a un objeto estable que est frente a usted, de modo que la banda est a la altura del hombro. Sostenga un extremo de la banda para ejercicios en cada mano. Lleve los brazos extendidos adelante. Camine hacia atrs, alejndose del  extremo fijo de la banda para ejercicios, hasta que [place] se tense. Tire la [employer] Mientras hace esto, flexione los codos y [employer], pero evite mover el resto del cuerpo. No levante los hombros [employer]. Detngase cuando los codos estn a los lados o ligeramente detrs del cuerpo. Mantenga esta posicin durante __________ segundos. Extienda lentamente los brazos para regresar a la posicin inicial.  Repita __________ [employer]. Realice este ejercicio __________ veces al da. Theotis Barrio Y [employer]PORAL La Therapist, nutritional se refiere  a los movimientos y a las posiciones del cuerpo [employer] actividades diarias. La postura es una parte de la Therapist, nutritional. La buena postura y la Engineer, agricultural corporal saludable pueden ayudar a Theatre stage manager estrs en las articulaciones y los tejidos del cuerpo. La buena postura significa que la columna mantiene su posicin natural de curvatura en forma de S (la columna est en una posicin neutral), los hombros Lucianne Lei un poco hacia atrs y la cabeza no se inclina hacia adelante. A continuacin, se incluyen pautas generales para mejorar la postura y Quarry manager en las actividades diarias. De pie Al estar de pie, mantenga la columna en la posicin neutral y los pies separados al ancho de caderas, aproximadamente. Mantenga las rodillas ligeramente flexionadas. [place], los hombros y las caderas deben estar alineados. Cuando realice una tarea en la que deba inclinarse hacia adelante y estar de pie en el mismo sitio durante mucho tiempo, coloque un pie en un objeto estable de 2 a 4 pulgadas (5 a 10 cm) de alto, como un taburete. Esto ayuda a que la columna mantenga una posicin neutral.  Sentado Cuando est sentado, mantenga la columna en posicin neutral y deje los pies apoyados en el suelo. Use un apoyapis, si es necesario, y [employer] muslos paralelos al suelo. Evite redondear los hombros e inclinar la cabeza hacia  adelante. Cuando trabaje en un escritorio o con una computadora, el escritorio debe estar a una altura en la que las manos estn un poco ms abajo que los codos. Deslice la silla debajo del escritorio, de modo de estar lo suficientemente cerca como para mantener una buena [place]. Cuando trabaje con una computadora, coloque el monitor a una altura que le permita mirar derecho hacia adelante, sin tener que inclinar la cabeza hacia adelante o [place] atrs.  Reposo Al descansar o estar acostado, evite las posiciones que le causen ms dolor. Si siente dolor al hacer actividades que exigen sentarse, inclinarse, agacharse o ponerse en cuclillas (actividades basadas en la flexin), acustese en una posicin en la que el cuerpo no deba doblarse mucho. Por ejemplo, evite acurrucarse de costado con los brazos y las rodillas cerca del pecho (posicin fetal). Si siente dolor con las actividades que exigen estar de pie durante mucho tiempo o Training and development officer los brazos (actividades basadas en la extensin), acustese con la columna en una posicin neutral y flexione ligeramente las rodillas. Pruebe con las siguientes posiciones: Acostarse de costado con una almohada entre las rodillas. Acostarse boca arriba con una almohada debajo de las rodillas.  Levantar objetos Cuando tenga que levantar un objeto, mantenga los pies separados el ancho de los hombros y apriete los msculos abdominales. [place] y la cadera, y Quarry manager la columna en posicin neutral. Es importante levantar utilizando la fuerza de las piernas, no de la espalda. No trabe las rodillas hacia afuera. Siempre pida ayuda a otra persona para levantar objetos pesados o incmodos.  Esta informacin no tiene Marine scientist el consejo del mdico. Asegrese de hacerle al mdico cualquier pregunta que tenga. Document Released: 10/16/2005 Document Revised: 05/16/2014 Document Reviewed: 10/11/2014 Elsevier Interactive Patient Education  [employer]. _____ times a day. Posture and body mechanics  Body mechanics refers to the movements and positions of your body while you do your daily activities. Posture is part of body mechanics. Good posture and healthy body mechanics can help to relieve stress in your body's tissues and joints. Good posture means that your spine is in  its natural S-curve position (your spine is neutral), your shoulders are pulled back slightly, and your head is not tipped forward. The following are general guidelines for applying improved posture and body mechanics to your everyday activities. Standing   When standing, keep your spine neutral and your feet about hip-width apart. Keep a slight bend in your knees. Your ears, shoulders, and hips should line up.  When you do a task in which you lean forward while standing in one place for a long time, place one foot up on a stable object that is 2-4 inches (5-10 cm) high, such as a footstool. This helps keep your spine neutral. Sitting   When sitting, keep your spine neutral and keep your feet flat on the floor. Use a footrest, if necessary, and keep your thighs parallel to the floor. Avoid rounding your shoulders, and avoid tilting your head forward.  When working at a desk or a computer, keep your desk at a height where your hands are slightly lower than your elbows. Slide your chair under your desk so you are close enough to maintain good posture.  When working at a computer, place your monitor at a height where you are looking straight ahead and you do not have to tilt your head forward or downward to look at the screen. Resting  When lying down and resting, avoid positions that are most painful for you.  If you have pain with activities such as sitting, bending, stooping, or squatting (flexion-based activities), lie in a position in which your body does not bend very much. For example, avoid curling up on your side with your arms and knees near your chest (fetal  position).  If you have pain with activities such as standing for a long time or reaching with your arms (extension-based activities), lie with your spine in a neutral position and bend your knees slightly. Try the following positions:  Lying on your side with a pillow between your knees.  Lying on your back with a pillow under your knees.  Lifting   When lifting objects, keep your feet at least shoulder-width apart and tighten your abdominal muscles.  Bend your knees and hips and keep your spine neutral. It is important to lift using the strength of your legs, not your back. Do not lock your knees straight out.  Always ask for help to lift heavy or awkward objects. This information is not intended to replace advice given to you by your health care provider. Make sure you discuss any questions you have with your health care provider. Document Released: 12/30/2004 Document Revised: 09/06/2015 Document Reviewed: 10/11/2014 Elsevier Interactive Patient Education  [employer].

## 2017-03-31 NOTE — Progress Notes (Signed)
Subjective:    Patient ID: Denise Gordon, female    DOB: 1970-07-21, 47 y.o.   MRN: [id number]  47y/o Hispanic female established patient here for evaluation recurrent back pain.  Wondering if she needs xrays checked again because pain occurring more frequently especially upon awakening and after watching TV lying on sofa.  And over the winter.  Pain stiffness midback sometimes has pain  Shoot down her arm intermittently.  Denied MVA, trauma, falls, loss of bowel/bladder control, saddle paresthesias or weakness in her arms/legs.  Forward flexion causes the most pain and popping in her back.  She reports she has never been able to touch her toes.  Trying tylenol extra strength 1-2 tabs not usually every day.  Has also tried motrin but doesn't notice a difference that one works better than the other.      Review of Systems  Constitutional: Negative for activity change, appetite change, chills, diaphoresis, fatigue, fever and unexpected weight change.  HENT: Negative for trouble swallowing and voice change.   Eyes: Negative for photophobia and visual disturbance.  Cardiovascular: Negative for chest pain.  Gastrointestinal: Negative for diarrhea, nausea and vomiting.  Endocrine: Negative for cold intolerance and heat intolerance.  Genitourinary: Negative for dysuria and enuresis.  Musculoskeletal: Positive for back pain and myalgias. Negative for arthralgias, gait problem, joint swelling, neck pain and neck stiffness.  Skin: Negative for color change, pallor, rash and wound.  Allergic/Immunologic: Positive for environmental allergies. Negative for food allergies.  Neurological: Negative for dizziness, tremors, seizures, syncope, facial asymmetry, speech difficulty, weakness, light-headedness, numbness and headaches.  Hematological: Negative for adenopathy. Does not bruise/bleed easily.  Psychiatric/Behavioral: Negative for agitation, confusion and sleep disturbance.       Objective:   Physical Exam  Constitutional: She is oriented to person, place, and time. Vital signs are normal. She appears well-developed and well-nourished. She is active and cooperative.  Non-toxic appearance. She does not have a sickly appearance. She does not appear ill. No distress.  HENT:  Head: Normocephalic and atraumatic.  Right Ear: Hearing and external ear normal.  Left Ear: Hearing and external ear normal.  Nose: No mucosal edema, rhinorrhea, nose lacerations, sinus tenderness, nasal deformity, septal deviation or nasal septal hematoma. No epistaxis.  No foreign bodies.  Mouth/Throat: Uvula is midline, oropharynx is clear and moist and mucous membranes are normal. Mucous membranes are not pale, not dry and not cyanotic. She does not have dentures. No oral lesions. No trismus in the jaw. Normal dentition. No dental abscesses, uvula swelling, lacerations or dental caries. No oropharyngeal exudate, posterior oropharyngeal edema, posterior oropharyngeal erythema or tonsillar abscesses.  Eyes: Conjunctivae, EOM and lids are normal. Pupils are equal, round, and reactive to light. Right eye exhibits no chemosis, no discharge, no exudate and no hordeolum. No foreign body present in the right eye. Left eye exhibits no chemosis, no discharge, no exudate and no hordeolum. No foreign body present in the left eye. Right conjunctiva is not injected. Right conjunctiva has no hemorrhage. Left conjunctiva is not injected. Left conjunctiva has no hemorrhage. No scleral icterus. Right eye exhibits normal extraocular motion and no nystagmus. Left eye exhibits normal extraocular motion and no nystagmus. Right pupil is round and reactive. Left pupil is round and reactive. Pupils are equal.  Neck: Trachea normal, normal range of motion and phonation normal. Neck supple. No tracheal tenderness, no spinous process tenderness and no muscular tenderness present. No neck rigidity. No tracheal deviation, no edema, no erythema and  normal  range of motion present. No thyroid mass and no thyromegaly present.  Cardiovascular: Normal rate, regular rhythm and intact distal pulses.  Pulses:      Radial pulses are 2+ on the right side, and 2+ on the left side.  Pulmonary/Chest: Effort normal and breath sounds normal. No accessory muscle usage or stridor. No respiratory distress. She has no decreased breath sounds. She has no wheezes. She has no rhonchi. She has no rales. She exhibits no tenderness.  Abdominal: Soft. Normal appearance. She exhibits no distension, no fluid wave and no ascites. There is no rigidity and no guarding.  Musculoskeletal: She exhibits no edema or tenderness.       Right shoulder: Normal.       Left shoulder: Normal.       Right elbow: Normal.      Left elbow: Normal.       Right hip: Normal.       Left hip: Normal.       Right knee: Normal.       Left knee: Normal.       Cervical back: Normal.       Thoracic back: She exhibits decreased range of motion and pain. She exhibits no tenderness, no bony tenderness, no swelling, no edema, no deformity, no laceration, no spasm and normal pulse.       Lumbar back: She exhibits normal range of motion, no tenderness, no bony tenderness, no swelling, no edema, no deformity, no laceration, no pain, no spasm and normal pulse.       Back:       Right hand: Normal.       Left hand: Normal.  Negative CVA tenderness bilaterally; in/out of chair; normal heel toe walk; no limp; able to touch shins forward flexion; bilateral rotation/lateral bending without pain/decreased AROM; c-spine full AROM normal no pain; bilateral hand grasp equal 5/5; full shoulder AROM bilaterally no pain  Lymphadenopathy:       Head (right side): No submental, no submandibular, no tonsillar, no preauricular, no posterior auricular and no occipital adenopathy present.       Head (left side): No submental, no submandibular, no tonsillar, no preauricular, no posterior auricular and no occipital  adenopathy present.    She has no cervical adenopathy.       Right cervical: No superficial cervical, no deep cervical and no posterior cervical adenopathy present.      Left cervical: No superficial cervical, no deep cervical and no posterior cervical adenopathy present.  Neurological: She is alert and oriented to person, place, and time. She has normal strength. She is not disoriented. She displays no atrophy and no tremor. No cranial nerve deficit or sensory deficit. She exhibits normal muscle tone. She displays no seizure activity. Coordination and gait normal. GCS eye subscore is 4. GCS verbal subscore is 5. GCS motor subscore is 6.  Gait sure and steady in hallway; in/out of chair without difficulty  Skin: Skin is warm, dry and intact. No abrasion, no bruising, no burn, no ecchymosis, no laceration, no lesion, no petechiae and no rash noted. She is not diaphoretic. No cyanosis or erythema. No pallor. Nails show no clubbing.  Psychiatric: She has a normal mood and affect. Her speech is normal and behavior is normal. Judgment and thought content normal. Cognition and memory are normal.  Nursing note and vitals reviewed.         Assessment & Plan:  A-thoracic osteoarthritis with radiculopathy  P-Given thermacare patches x2 one to  apply now then 1 tomorrow due to freezing temperatures at night worsening pain (cold air) per patient from clinic stock.   Discussed may alternate motrin 800mg  po TID prn pain take with food and tylenol 1000mg  will start with increasing tylenol 1000mg  po QID prn frequency as currently only once or twice a day.  Apply  biofreeze  Topical QID trial given 4 UD from clinic stock.  Discussed Exercises/warm ups especially in am.  Consider Heat pad/epsom salt soak/bath  Avoid holding static positions prolonged periods as will worsen pain/stiffness.  Tylenol 1000mg  po QID prn pain at home OTC  Ibuprofen 800mg  po TID prn pain  May alternate with tylenol if no relief with just  tylenol.  Take motrin with food.  Discussed cold front and freezing temps last night worsen arthritis pain.  Discussed previous CT thoracic and diagnostic xray results with patient DJD chronic typically.   Home stretches demonstrated to patient-e.g. Arm circles, walking up wall, chest stretches, neck AROM, chin tucks, knee to chest and rock side to side on back. Self massage or professional prn, foam roller use or tennis/racquetball.  Heat/cryotherapy 15 minutes QID prn.  Trial thermacare 1 applied and another given to patient for use tomorrow from clinic stock.  Consider physical therapy referral if no improvement with prescribed therapy from [facility] and/or chiropractic care.  Ensure ergonomics correct desk at work avoid repetitive motions if possible/holding phone/laptop in hand use desk/stand and/or break up lifting items into smaller loads/weights.  Patient was instructed to rest, ice, and ROM exercises.  Activity as tolerated.   Follow up if symptoms persist or worsen especially if loss of bowel/bladder control, arm/leg weakness and/or saddle paresthesias.  [facility]care handout on mid back strain rehab exercises and arthritis given to patient.  Patient verbalized agreement and understanding of treatment plan and had no further questions at this time.  P2:  Injury Prevention and Fitness.

## 2017-04-20 IMAGING — US US ABDOMEN COMPLETE
1 series · 14 of 25 positions shown · non-contrast
Comparison: None in PACs

CLINICAL DATA: Abdominal pain radiating to the back for the past 3
months

EXAM:
ABDOMEN ULTRASOUND COMPLETE

[Series 1: us abdomen complete · 0.22mm/px · 14 of 93 slices shown]
[im 1/93]
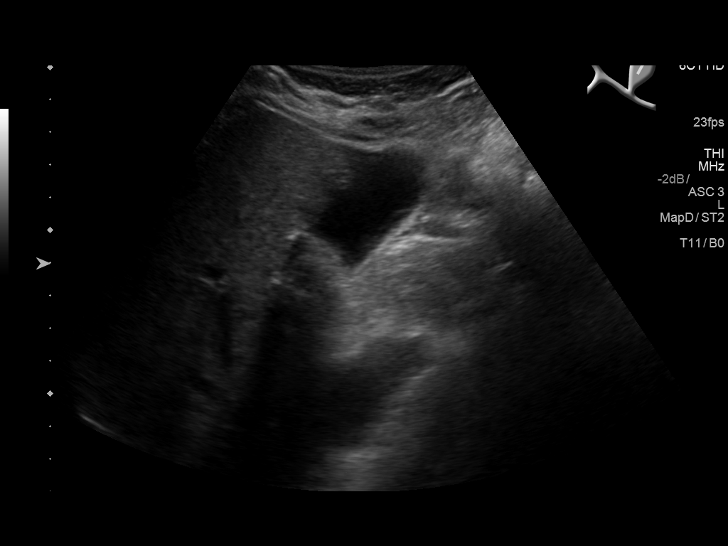
[im 8/93]
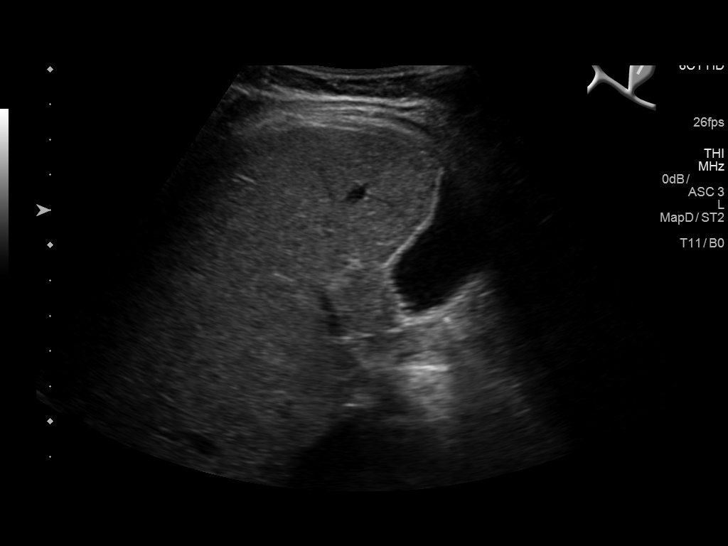
[im 16/93]
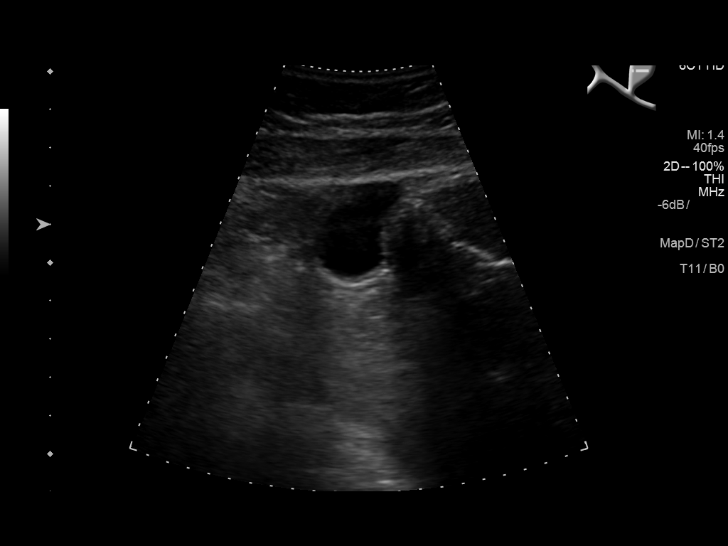
[im 24/93]
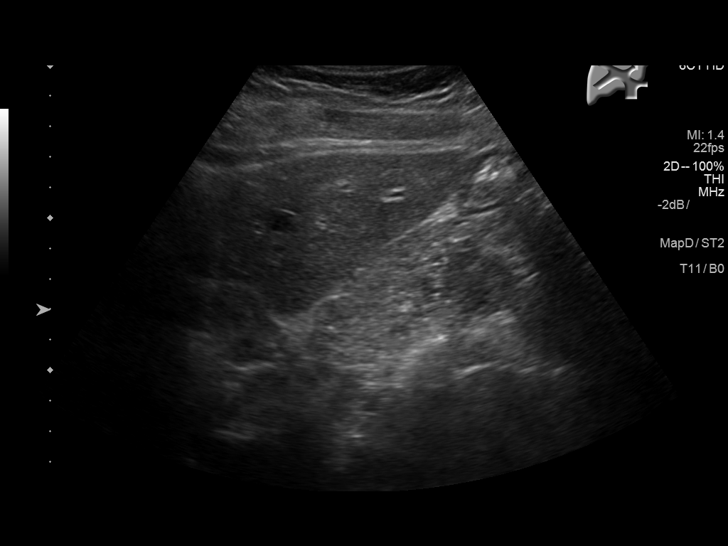
[im 31/93]
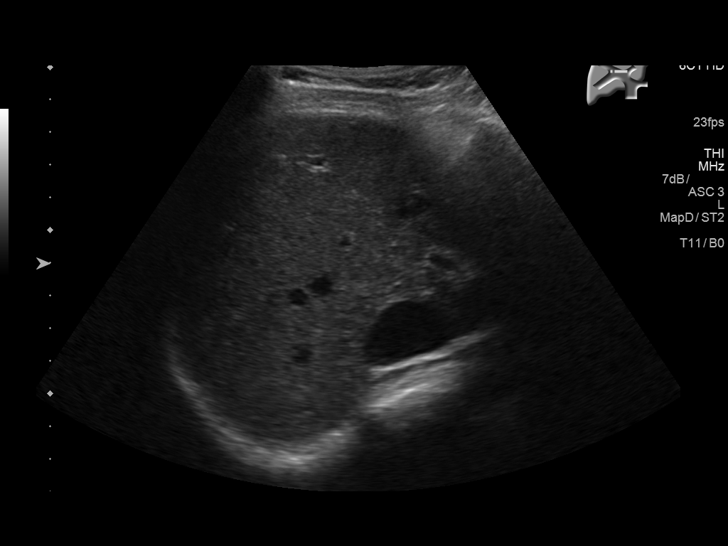
[im 35/93]
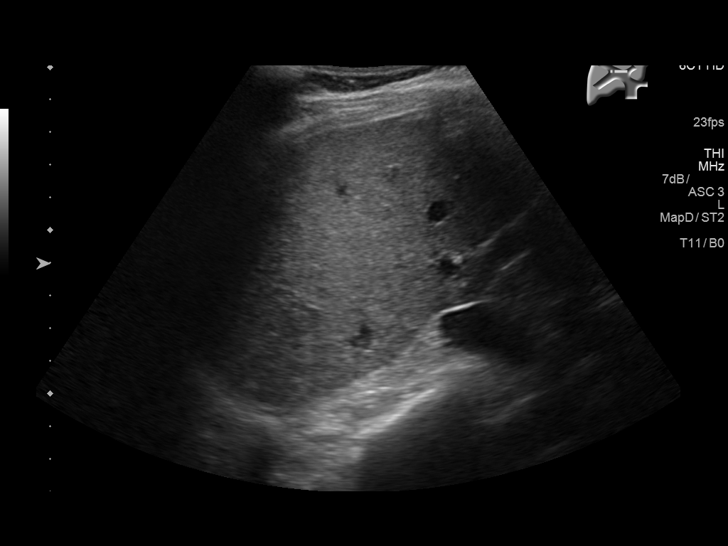
[im 43/93]
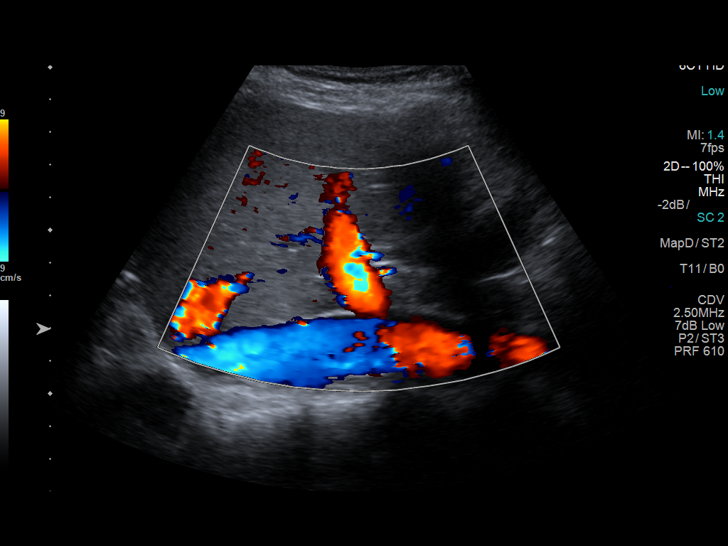
[im 50/93]
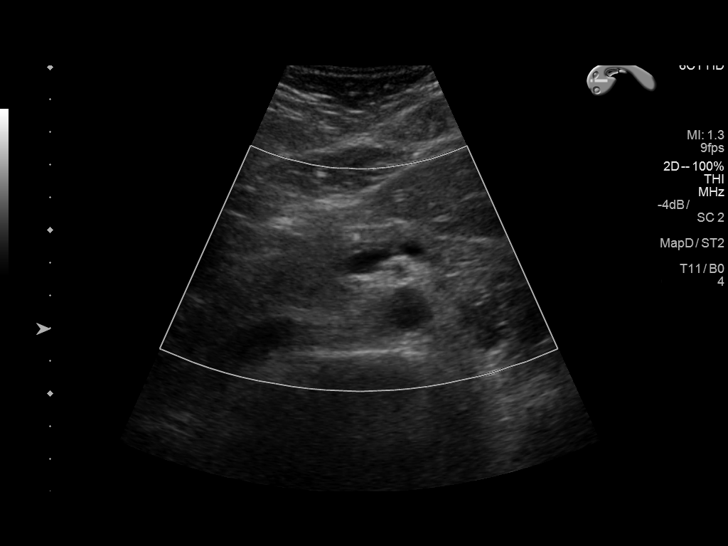
[im 58/93]
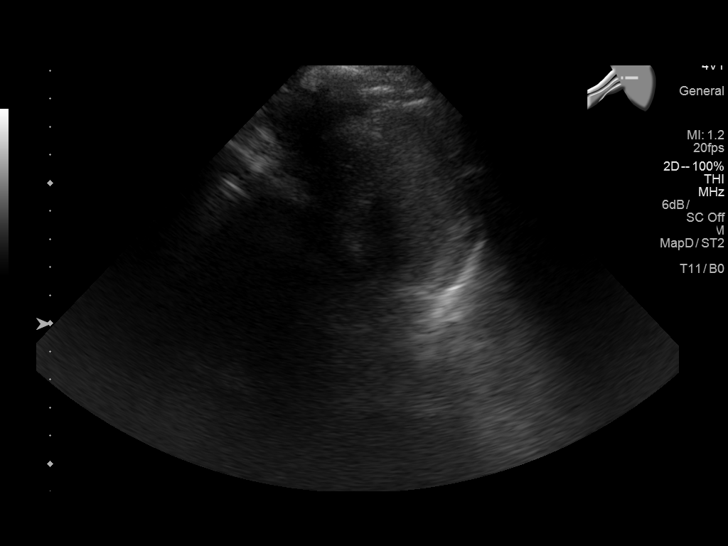
[im 62/93]
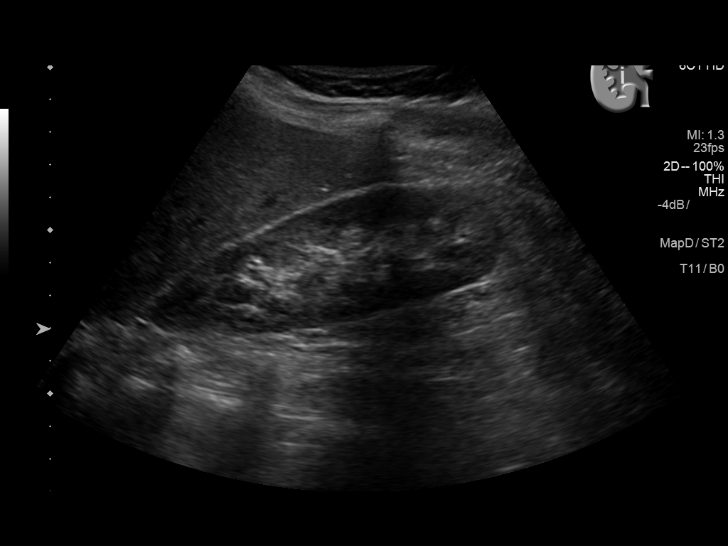
[im 70/93]
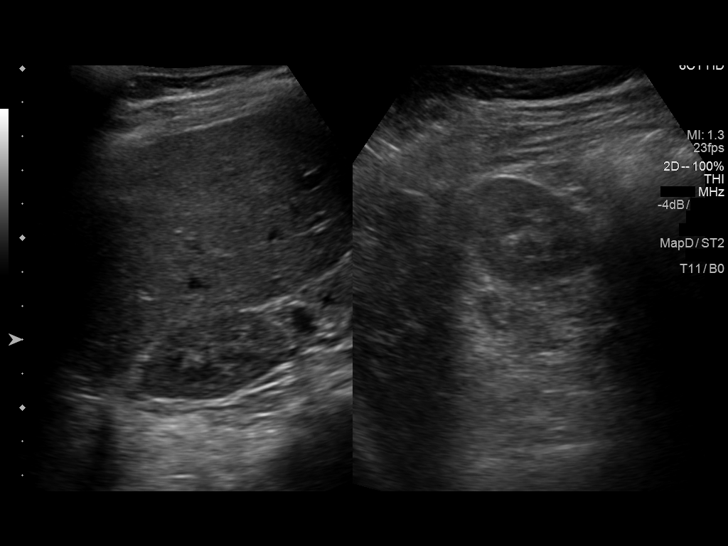
[im 77/93]
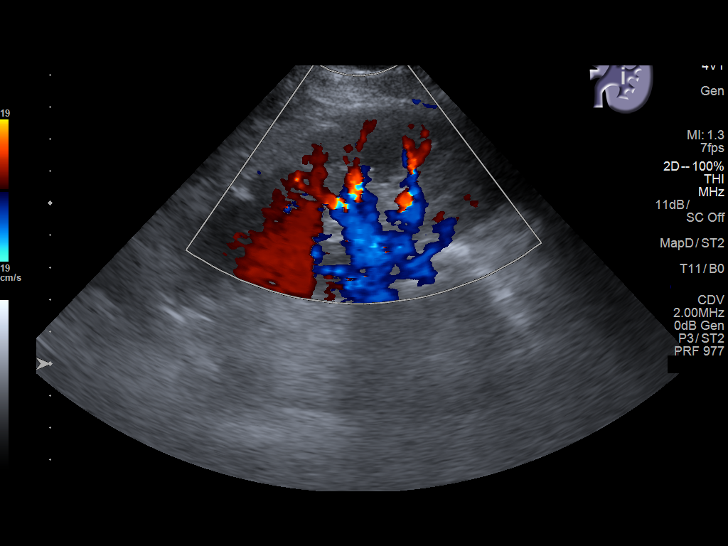
[im 85/93]
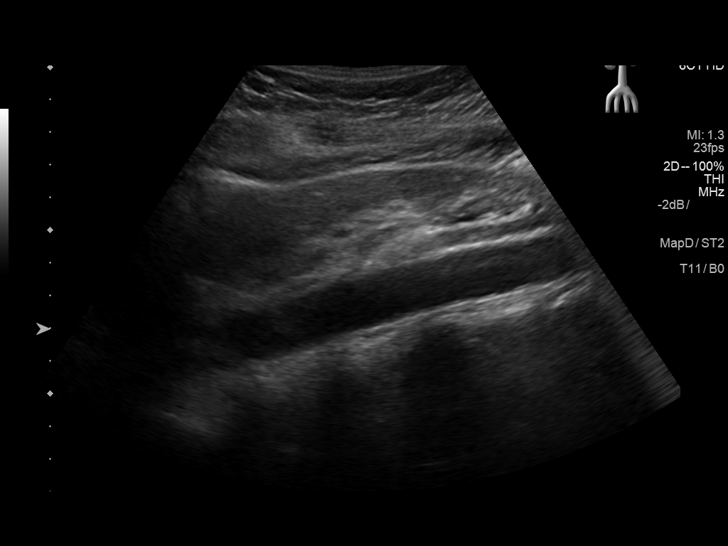
[im 93/93]
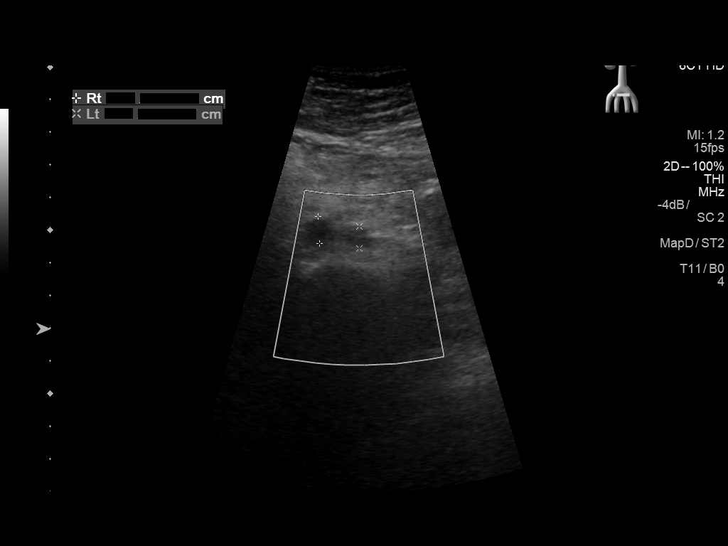

[14 of 25 positions shown; findings below may reference images not displayed]

FINDINGS: Gallbladder: No gallstones or wall thickening visualized. No
sonographic Murphy sign noted by sonographer.

Common bile duct: Diameter: 2.4 mm

Liver: No focal lesion identified. Within normal limits in
parenchymal echogenicity.

IVC: No abnormality visualized.

Pancreas: Visualized portion unremarkable.

Spleen: Size and appearance within normal limits.

Right Kidney: Length: 11.2 cm. Echogenicity within normal limits. No
mass or hydronephrosis visualized.

Left Kidney: Length: 10.7 cm. Echogenicity within normal limits. No
mass or hydronephrosis visualized.

Abdominal aorta: No aneurysm visualized.

Other findings: There is no ascites.
IMPRESSION: 1. No gallstones or sonographic evidence of acute cholecystitis. If
there are clinical concerns of chronic cholecystitis, a nuclear
medicine hepatobiliary scan with gallbladder ejection fraction
determination may be useful.
2. No acute abnormality is observed elsewhere within the abdomen.

## 2017-04-28 ENCOUNTER — Ambulatory Visit: Payer: Self-pay | Admitting: Registered Nurse

## 2017-04-28 VITALS — BP 114/74 | HR 74 | Temp 99.7°F

## 2017-04-28 DIAGNOSIS — M546 Pain in thoracic spine: Secondary | ICD-10-CM

## 2017-04-28 DIAGNOSIS — G8929 Other chronic pain: Secondary | ICD-10-CM

## 2017-04-28 DIAGNOSIS — N644 Mastodynia: Secondary | ICD-10-CM

## 2017-04-28 DIAGNOSIS — R232 Flushing: Secondary | ICD-10-CM

## 2017-04-28 NOTE — Patient Instructions (Addendum)
Thyroid Nodule A thyroid nodule is an isolatedgrowth of thyroid cells that forms a lump in your thyroid gland. The thyroid gland is a butterfly-shaped gland. It is found in the lower front of your neck. This gland sends chemical messengers (hormones) through your blood to all parts of your body. These hormones are important in regulating your body temperature and helping your body to use energy. Thyroid nodules are common. Most are not cancerous (are benign). You may have one nodule or several nodules. Different types of thyroid nodules include:  Nodules that grow and fill with fluid (thyroid cysts).  Nodules that produce too much thyroid hormone (hot nodules or hyperthyroid).  Nodules that produce no thyroid hormone (cold nodules or hypothyroid).  Nodules that form from cancer cells (thyroid cancers).  What are the causes? Usually, the cause of this condition is not known. What increases the risk? Factors that make this condition more likely to develop include:  Increasing age. Thyroid nodules become more common in people who are older than 48 years of age.  Gender. ? Benign thyroid nodules are more common in women. ? Cancerous (malignant) thyroid nodules are more common in men.  A family history that includes: ? Thyroid nodules. ? Pheochromocytoma. ? Thyroid carcinoma. ? Hyperparathyroidism.  Certain kinds of thyroid diseases, such as Hashimoto thyroiditis.  Lack of iodine.  A history of head and neck radiation, such as from X-rays.  What are the signs or symptoms? It is common for this condition to cause no symptoms. If you have symptoms, they may include:  A lump in your lower neck.  Feeling a lump or tickle in your throat.  Pain in your neck, jaw, or ear.  Having trouble swallowing.  Hot nodules may cause symptoms that include:  Weight loss.  Warm, flushed skin.  Feeling hot.  Feeling nervous.  A racing heartbeat.  Cold nodules may cause symptoms that  include:  Weight gain.  Dry skin.  Brittle hair. This may also occur with hair loss.  Feeling cold.  Fatigue.  Thyroid cancer nodules may cause symptoms that include:  Hard nodules that feel stuck to the thyroid gland.  Hoarseness.  Lumps in the glands near your thyroid (lymph nodes).  How is this diagnosed? A thyroid nodule may be felt by your health care provider during a physical exam. This condition may also be diagnosed based on your symptoms. You may also have tests, including:  An ultrasound. This may be done to confirm the diagnosis.  A biopsy. This involves taking a sample from the nodule and looking at it under a microscope to see if the nodule is benign.  Blood tests to make sure that your thyroid is working properly.  Imaging tests such as MRI or CT scan may be done if: ? Your nodule is large. ? Your nodule is blocking your airway. ? Cancer is suspected.  How is this treated? Treatment depends on the cause and size of your nodule or nodules. If the nodule is benign, treatment may not be necessary. Your health care provider may monitor the nodule to see if it goes away without treatment. If the nodule continues to grow, is cancerous, or does not go away:  It may need to be drained with a needle.  It may need to be removed with surgery.  If you have surgery, part or all of your thyroid gland may need to be removed as well. Follow these instructions at home:  Pay attention to any changes in your  nodule.  Take over-the-counter and prescription medicines only as told by your health care provider.  Keep all follow-up visits as told by your health care provider. This is important. Contact a health care provider if:  Your voice changes.  You have trouble swallowing.  You have pain in your neck, ear, or jaw that is getting worse.  Your nodule gets bigger.  Your nodule starts to make it harder for you to breathe. Get help right away if:  You have a  sudden fever.  You feel very weak.  Your muscles look like they are shrinking (muscle wasting).  You have mood swings.  You feel very restless.  You feel confused.  You are seeing or hearing things that other people do not see or hear (having hallucinations).  You feel suddenly nauseous or throw up.  You suddenly have diarrhea.  You have chest pain.  There is a loss of consciousness. This information is not intended to replace advice given to you by your health care provider. Make sure you discuss any questions you have with your health care provider. Document Released: 11/23/2003 Document Revised: 09/02/2015 Document Reviewed: 04/12/2014 Elsevier Interactive Patient Education  2018 [employer]. Menopause Menopause is the normal time of life when menstrual periods stop completely. Menopause is complete when you have missed 12 consecutive menstrual periods. It usually occurs between the ages of 44 years and 29 years. Very rarely does a woman develop menopause before the age of 69 years. At menopause, your ovaries stop producing the female hormones estrogen and progesterone. This can cause undesirable symptoms and also affect your health. Sometimes the symptoms may occur 4-5 years before the menopause begins. There is no relationship between menopause and:  Oral contraceptives.  Number of children you had.  Race.  The age your menstrual periods started (menarche).  Heavy smokers and very thin women may develop menopause earlier in life. What are the causes?  The ovaries stop producing the female hormones estrogen and progesterone. Other causes include:  Surgery to remove both ovaries.  The ovaries stop functioning for no known reason.  Tumors of the pituitary gland in the brain.  Medical disease that affects the ovaries and hormone production.  Radiation treatment to the abdomen or pelvis.  Chemotherapy that affects the ovaries.  What are the signs or  symptoms?  Hot flashes.  Night sweats.  Decrease in sex drive.  Vaginal dryness and thinning of the vagina causing painful intercourse.  Dryness of the skin and developing wrinkles.  Headaches.  Tiredness.  Irritability.  Memory problems.  Weight gain.  Bladder infections.  Hair growth of the face and chest.  Infertility. More serious symptoms include:  Loss of bone (osteoporosis) causing breaks (fractures).  Depression.  Hardening and narrowing of the arteries (atherosclerosis) causing heart attacks and strokes.  How is this diagnosed?  When the menstrual periods have stopped for 12 straight months.  Physical exam.  Hormone studies of the blood. How is this treated? There are many treatment choices and nearly as many questions about them. The decisions to treat or not to treat menopausal changes is an individual choice made with your health care provider. Your health care provider can discuss the treatments with you. Together, you can decide which treatment will work best for you. Your treatment choices may include:  Hormone therapy (estrogen and progesterone).  Non-hormonal medicines.  Treating the individual symptoms with medicine (for example antidepressants for depression).  Herbal medicines that may help specific symptoms.  Counseling  by a psychiatrist or psychologist.  Group therapy.  Lifestyle changes including: ? Eating healthy. ? Regular exercise. ? Limiting caffeine and alcohol. ? Stress management and meditation.  No treatment.  Follow these instructions at home:  Take the medicine your health care provider gives you as directed.  Get plenty of sleep and rest.  Exercise regularly.  Eat a diet that contains calcium (good for the bones) and soy products (acts like estrogen hormone).  Avoid alcoholic beverages.  Do not smoke.  If you have hot flashes, dress in layers.  Take supplements, calcium, and vitamin D to strengthen  bones.  You can use over-the-counter lubricants or moisturizers for vaginal dryness.  Group therapy is sometimes very helpful.  Acupuncture may be helpful in some cases. Contact a health care provider if:  You are not sure you are in menopause.  You are having menopausal symptoms and need advice and treatment.  You are still having menstrual periods after age 71 years.  You have pain with intercourse.  Menopause is complete (no menstrual period for 12 months) and you develop vaginal bleeding.  You need a referral to a specialist (gynecologist, psychiatrist, or psychologist) for treatment. Get help right away if:  You have severe depression.  You have excessive vaginal bleeding.  You fell and think you have a broken bone.  You have pain when you urinate.  You develop leg or chest pain.  You have a fast pounding heart beat (palpitations).  You have severe headaches.  You develop vision problems.  You feel a lump in your breast.  You have abdominal pain or severe indigestion. This information is not intended to replace advice given to you by your health care provider. Make sure you discuss any questions you have with your health care provider. Document Released: 03/22/2003 Document Revised: 06/07/2015 Document Reviewed: 07/29/2012 Elsevier Interactive Patient Education  2017 [place] is the time when your body begins to move into the menopause (no menstrual period for 12 straight months). It is a natural process. Perimenopause can begin 2-8 years before the menopause and usually lasts for 1 year after the menopause. During this time, your ovaries may or may not produce an egg. The ovaries vary in their production of estrogen and progesterone hormones each month. This can cause irregular menstrual periods, difficulty getting pregnant, vaginal bleeding between periods, and uncomfortable symptoms. What are the causes?  Irregular production  of the ovarian hormones, estrogen and progesterone, and not ovulating every month. Other causes include:  Tumor of the pituitary gland in the brain.  Medical disease that affects the ovaries.  Radiation treatment.  Chemotherapy.  Unknown causes.  Heavy smoking and excessive alcohol intake can bring on perimenopause sooner.  What are the signs or symptoms?  Hot flashes.  Night sweats.  Irregular menstrual periods.  Decreased sex drive.  Vaginal dryness.  Headaches.  Mood swings.  Depression.  Memory problems.  Irritability.  Tiredness.  Weight gain.  Trouble getting pregnant.  The beginning of losing bone cells (osteoporosis).  The beginning of hardening of the arteries (atherosclerosis). How is this diagnosed? Your health care provider will make a diagnosis by analyzing your age, menstrual history, and symptoms. He or she will do a physical exam and note any changes in your body, especially your female organs. Female hormone tests may or may not be helpful depending on the amount of female hormones you produce and when you produce them. However, other hormone tests may be helpful to rule  out other problems. How is this treated? In some cases, no treatment is needed. The decision on whether treatment is necessary during the perimenopause should be made by you and your health care provider based on how the symptoms are affecting you and your lifestyle. Various treatments are available, such as:  Treating individual symptoms with a specific medicine for that symptom.  Herbal medicines that can help specific symptoms.  Counseling.  Group therapy.  Follow these instructions at home:  Keep track of your menstrual periods (when they occur, how heavy they are, how long between periods, and how long they last) as well as your symptoms and when they started.  Only take over-the-counter or prescription medicines as directed by your health care provider.  Sleep  and rest.  Exercise.  Eat a diet that contains calcium (good for your bones) and soy (acts like the estrogen hormone).  Do not smoke.  Avoid alcoholic beverages.  Take vitamin supplements as recommended by your health care provider. Taking vitamin E may help in certain cases.  Take calcium and vitamin D supplements to help prevent bone loss.  Group therapy is sometimes helpful.  Acupuncture may help in some cases. Contact a health care provider if:  You have questions about any symptoms you are having.  You need a referral to a specialist (gynecologist, psychiatrist, or psychologist). Get help right away if:  You have vaginal bleeding.  Your period lasts longer than 8 days.  Your periods are recurring sooner than 21 days.  You have bleeding after intercourse.  You have severe depression.  You have pain when you urinate.  You have severe headaches.  You have vision problems. This information is not intended to replace advice given to you by your health care provider. Make sure you discuss any questions you have with your health care provider. Document Released: 02/07/2004 Document Revised: 06/07/2015 Document Reviewed: 07/29/2012 Elsevier Interactive Patient Education  2017 [place].  Mid-Back Strain Rehab Ask your health care provider which exercises are safe for you. Do exercises exactly as told by your health care provider and adjust them as directed. It is normal to feel mild stretching, pulling, tightness, or discomfort as you do these exercises, but you should stop right away if you feel sudden pain or your pain gets worse. Do not begin these exercises until told by your health care provider. Stretching and range of motion exercises This exercise warms up your muscles and joints and improves the movement and flexibility of your back and shoulders. This exercise also help to relieve pain. Exercise A: Chest and spine stretch  1. Lie down on your back on a firm  surface. 2. Roll a towel or a small blanket so it is about 4 inches (10 cm) in diameter. 3. Put the towel lengthwise under the middle of your back so it is under your spine, but not under your shoulder blades. 4. To increase the stretch, you may put your hands behind your head and let your elbows fall to your sides. 5. Hold for ____15______ seconds. Repeat exercise ___3_______ times. Complete this exercise ____2______ times a day. Strengthening exercises These exercises build strength and endurance in your back and your shoulder blade muscles. Endurance is the ability to use your muscles for a long time, even after they get tired. Exercise B: Alternating arm and leg raises  1. Get on your hands and knees on a firm surface. If you are on a hard floor, you may want to use padding  to cushion your knees, such as an exercise mat. 2. Line up your arms and legs. Your hands should be below your shoulders, and your knees should be below your hips. 3. Lift your left leg behind you. At the same time, raise your right arm and straighten it in front of you. ? Do not lift your leg higher than your hip. ? Do not lift your arm higher than your shoulder. ? Keep your abdominal and back muscles tight. ? Keep your hips facing the ground. ? Do not arch your back. ? Keep your balance carefully, and do not hold your breath. 4. Hold for ____10______ seconds. 5. Slowly return to the starting position and repeat with your right leg and your left arm. Repeat _____2_____ times. Complete this exercise ____2______ times a day. Exercise C: Straight arm rows ( shoulder extension) 1. Stand with your feet shoulder width apart. 2. Secure an exercise band to a stable object in front of you so the band is at or above shoulder height. 3. Hold one end of the exercise band in each hand. 4. Straighten your elbows and lift your hands up to shoulder height. 5. Step back, away from the secured end of the exercise band, until the  band stretches. 6. Squeeze your shoulder blades together and pull your hands down to the sides of your thighs. Stop when your hands are straight down by your sides. Do not let your hands go behind your body. 7. Hold for _____10_____ seconds. 8. Slowly return to the starting position. Repeat _____2_____ times. Complete this exercise __2________ times a day. Exercise D: Shoulder external rotation, prone 1. Lie on your abdomen on a firm bed so your left / right forearm hangs over the edge of the bed and your upper arm is on the bed, straight out from your body. ? Your elbow should be bent. ? Your palm should be facing your feet. 2. If instructed, hold a _____no_____ weight in your hand. 3. Squeeze your shoulder blade toward the middle of your back. Do not let your shoulder lift toward your ear. 4. Keep your elbow bent in an "L" shape (90 degrees) while you slowly move your forearm up toward the ceiling. Move your forearm up to the height of the bed, toward your head. ? Your upper arm should not move. ? At the top of the movement, your palm should face the floor. 5. Hold for _____10_____ seconds. 6. Slowly return to the starting position and relax your muscles. Repeat ___2_______ times. Complete this exercise _____2_____ times a day. Exercise E: Scapular retraction and external rotation, rowing  1. Sit in a stable chair without armrests, or stand. 2. Secure an exercise band to a stable object in front of you so it is at shoulder height. 3. Hold one end of the exercise band in each hand. 4. Bring your arms out straight in front of you. 5. Step back, away from the secured end of the exercise band, until the band stretches. 6. Pull the band backward. As you do this, bend your elbows and squeeze your shoulder blades together, but avoid letting the rest of your body move. Do not let your shoulders lift up toward your ears. 7. Stop when your elbows are at your sides or slightly behind your  body. 8. Hold for _____10_____ seconds. 9. Slowly straighten your arms to return to the starting position. Repeat ___2_______ times. Complete this exercise _____2_____ times a day. Posture and body mechanics  Body mechanics refers to  the movements and positions of your body while you do your daily activities. Posture is part of body mechanics. Good posture and healthy body mechanics can help to relieve stress in your body's tissues and joints. Good posture means that your spine is in its natural S-curve position (your spine is neutral), your shoulders are pulled back slightly, and your head is not tipped forward. The following are general guidelines for applying improved posture and body mechanics to your everyday activities. Standing   When standing, keep your spine neutral and your feet about hip-width apart. Keep a slight bend in your knees. Your ears, shoulders, and hips should line up.  When you do a task in which you lean forward while standing in one place for a long time, place one foot up on a stable object that is 2-4 inches (5-10 cm) high, such as a footstool. This helps keep your spine neutral. Sitting   When sitting, keep your spine neutral and keep your feet flat on the floor. Use a footrest, if necessary, and keep your thighs parallel to the floor. Avoid rounding your shoulders, and avoid tilting your head forward.  When working at a desk or a computer, keep your desk at a height where your hands are slightly lower than your elbows. Slide your chair under your desk so you are close enough to maintain good posture.  When working at a computer, place your monitor at a height where you are looking straight ahead and you do not have to tilt your head forward or downward to look at the screen. Resting  When lying down and resting, avoid positions that are most painful for you.  If you have pain with activities such as sitting, bending, stooping, or squatting (flexion-based  activities), lie in a position in which your body does not bend very much. For example, avoid curling up on your side with your arms and knees near your chest (fetal position).  If you have pain with activities such as standing for a long time or reaching with your arms (extension-based activities), lie with your spine in a neutral position and bend your knees slightly. Try the following positions:  Lying on your side with a pillow between your knees.  Lying on your back with a pillow under your knees.  Lifting   When lifting objects, keep your feet at least shoulder-width apart and tighten your abdominal muscles.  Bend your knees and hips and keep your spine neutral. It is important to lift using the strength of your legs, not your back. Do not lock your knees straight out.  Always ask for help to lift heavy or awkward objects. This information is not intended to replace advice given to you by your health care provider. Make sure you discuss any questions you have with your health care provider. Document Released: 12/30/2004 Document Revised: 09/06/2015 Document Reviewed: 10/11/2014 Elsevier Interactive Patient Education  2018 [place]. Degenerative Disk Disease Degenerative disk disease is a condition caused by the changes that occur in spinal disks as you grow older. Spinal disks are soft and compressible disks located between the bones of your spine (vertebrae). These disks act like shock absorbers. Degenerative disk disease can affect the whole spine. However, the neck and lower back are most commonly affected. Many changes can occur in the spinal disks with aging, such as:  The spinal disks may dry and shrink.  Small tears may occur in the tough, outer covering of the disk (annulus).  The disk space  may become smaller due to loss of water.  Abnormal growths in the bone (spurs) may occur. This can put pressure on the nerve roots exiting the spinal canal, causing pain.  The  spinal canal may become narrowed.  What increases the risk?  Being overweight.  Having a family history of degenerative disk disease.  Smoking.  There is increased risk if you are doing heavy lifting or have a sudden injury. What are the signs or symptoms? Symptoms vary from person to person and may include:  Pain that varies in intensity. Some people have no pain, while others have severe pain. The location of the pain depends on the part of your backbone that is affected. ? You will have neck or arm pain if a disk in the neck area is affected. ? You will have pain in your back, buttocks, or legs if a disk in the lower back is affected.  Pain that becomes worse while bending, reaching up, or with twisting movements.  Pain that may start gradually and then get worse as time passes. It may also start after a major or minor injury.  Numbness or tingling in the arms or legs.  How is this diagnosed? Your health care provider will ask you about your symptoms and about activities or habits that may cause the pain. He or she may also ask about any injuries, diseases, or treatments you have had. Your health care provider will examine you to check for the range of movement that is possible in the affected area, to check for strength in your extremities, and to check for sensation in the areas of the arms and legs supplied by different nerve roots. You may also have:  An X-ray of the spine.  Other imaging tests, such as MRI.  How is this treated? Your health care provider will advise you on the best plan for treatment. Treatment may include:  Medicines.  Rehabilitation exercises.  Follow these instructions at home:  Follow proper lifting and walking techniques as advised by your health care provider.  Maintain good posture.  Exercise regularly as advised by your health care provider.  Perform relaxation exercises.  Change your sitting, standing, and sleeping habits as advised by  your health care provider.  Change positions frequently.  Lose weight or maintain a healthy weight as advised by your health care provider.  Do not use any tobacco products, including cigarettes, chewing tobacco, or electronic cigarettes. If you need help quitting, ask your health care provider.  Wear supportive footwear.  Take medicines only as directed by your health care provider. Contact a health care provider if:  Your pain does not go away within 1-4 weeks.  You have significant appetite or weight loss. Get help right away if:  Your pain is severe.  You notice weakness in your arms, hands, or legs.  You begin to lose control of your bladder or bowel movements.  You have fevers or night sweats. This information is not intended to replace advice given to you by your health care provider. Make sure you discuss any questions you have with your health care provider. Document Released: 10/27/2006 Document Revised: 06/07/2015 Document Reviewed: 05/03/2013 Elsevier Interactive Patient Education  2018 [employer].  What You Need to Know About Osteoarthritis Osteoarthritis is a type of arthritis that affects tissue that covers the ends of bones in joints (cartilage). Cartilage acts as a cushion between the bones and helps them move smoothly. Osteoarthritis results when cartilage in the joints gets worn  down. Osteoarthritis is sometimes called "wear and tear" arthritis. Osteoarthritis can affect any joint and can make movement painful. Hips, knees, fingers, and toes are some of the joints that are most often affected by osteoarthritis. You may be more likely to develop osteoarthritis if:  You are middle-aged or older.  You are obese.  You have injured a joint or had surgery on a joint.  You have a family history of osteoarthritis.  How can osteoarthritis affect me? Osteoarthritis can cause:  Pain and swelling in your joint.  Difficulty moving your joint.  A grating or  scraping feeling inside the joint when you move it.  Popping or creaking sounds when you move.  This condition can make it harder to do things that you need or want to do each day. Osteoarthritis in a major joint, such as your knee or hip, can make it painful to walk or exercise. If you have osteoarthritis in your hands, you might not be able to grip items, twist your hand, or control small movements of your hands and fingers (fine motor skills). Over time, osteoarthritis could cause you to be less physically active. Being less active increases your risk for other long-term (chronic) health problems, such as diabetes and heart disease. What lifestyle changes can be made? You can lessen the impact that osteoarthritis has on your daily life by:  Switching to low-impact activities that do not put repeated pressure on your joints. For example, if you usually run or jog for exercise, try swimming or riding a bike instead.  Staying active. Build up to at least 150 minutes of physical activity each week to keep your joints healthy and keep your body strong.  Losing weight. If you are overweight or obese, losing weight can take pressure off of your joints. If you need help with weight loss, talk with your health care provider or a diet and nutrition specialist (dietitian).  What other changes can be made? You can also lessen the effect of osteoarthritis by:  Using assistive devices. Sometimes a brace, wrap, splint, specialized glove, or cane can help. Talk with your health care provider or physical therapist about when and how to use these.  Working with a physical therapist who can help you find ways to do your daily activities without harming your joints. A physical therapist can also teach you exercises and stretches to strengthen the muscles that support your joints.  Treating pain and inflammation. Take over-the-counter and prescription medicines for pain and inflammation only as told by your  health care provider. If directed, you may put ice on the affected joint: ? If you have a removable assistive device, remove it as told by your health care provider. ? Put ice in a plastic bag. ? Place a towel between your skin and the bag. ? Leave the ice on for 20 minutes, 2-3 times a day.  If other measures do not work, you may need joint surgery, such as joint replacement. What can happen if changes are not made? Osteoarthritis is a condition that gets worse over time (a progressive condition). If you do not take steps to strengthen your body and to slow down the progress of the disease, your condition may get worse more quickly. Your joints may stiffen and become swollen, which will make them painful and hard to move. Where to find more information: You can learn more about osteoarthritis from:  [facility] [place]: www.RadioScam.is  [employer] of Arthritis and Musculoskeletal and Skin Diseases: www.niams.CityPerson.tn  Contact a health care provider if:  You cannot do your normal activities comfortably.  Your joint does not function at all.  Your pain is interfering with your sleep.  You are gaining weight.  Your joint appears to be changing in shape, instead of just being swollen and sore. Summary  Osteoarthritis is a painful joint disease that gets worse over time.  This condition can lead to other long-term (chronic) health problems.  There are changes that you can make to slow down the progression of the disease. This information is not intended to replace advice given to you by your health care provider. Make sure you discuss any questions you have with your health care provider. Document Released: 08/21/2015 Document Revised: 08/23/2015 Document Reviewed: 08/21/2015 Elsevier Interactive Patient Education  2018 [employer].  Breast Tenderness Breast tenderness is a common  problem for women of all ages. Breast tenderness may cause mild discomfort to severe pain. The pain usually comes and goes in association with your menstrual cycle, but it can be constant. Breast tenderness has many possible causes, including hormone changes and some medicines. Your health care provider may order tests, such as a mammogram or an ultrasound, to check for any unusual findings. Having breast tenderness usually does not mean that you have breast cancer. Follow these instructions at home: Sometimes, reassurance that you do not have breast cancer is all that is needed. In general, follow these home care instructions: Managing pain and discomfort  If directed, apply ice to the area: ? Put ice in a plastic bag. ? Place a towel between your skin and the bag. ? Leave the ice on for 20 minutes, 2-3 times a day.  Make sure you are wearing a supportive bra, especially during exercise. You may also want to wear a supportive bra while sleeping if your breasts are very tender. Medicines  Take over-the-counter and prescription medicines only as told by your health care provider. If the cause of your pain is infection, you may be prescribed an antibiotic medicine.  If you were prescribed an antibiotic, take it as told by your health care provider. Do not stop taking the antibiotic even if you start to feel better. General instructions  Your health care provider may recommend that you reduce the amount of fat in your diet. You can do this by: ? Limiting fried foods. ? Cooking foods using methods, such as baking, boiling, grilling, and broiling.  Decrease the amount of caffeine in your diet. You can do this by drinking more water and choosing caffeine-free options.  Keep a log of the days and times when your breasts are most tender.  Ask your health care provider how to do breast exams at home. This will help you notice if you have an unusual growth or lump. Contact a health care provider  if:  Any part of your breast is hard, red, and hot to the touch. This may be a sign of infection.  You are not breastfeeding and you have fluid, especially blood or pus, coming out of your nipples.  You have a fever.  You have a new or painful lump in your breast that remains after your menstrual period ends.  Your pain does not improve or it gets worse.  Your pain is interfering with your daily activities. This information is not intended to replace advice given to you by your health care provider. Make sure you discuss any questions you have with your health care provider. Document Released: 12/13/2007 Document  Revised: 09/28/2015 Document Reviewed: 09/28/2015 Elsevier Interactive Patient Education  [employer].

## 2017-04-28 NOTE — Progress Notes (Signed)
Subjective:    Patient ID: Denise Gordon, female    DOB: January 25, 1970, 47 y.o.   MRN: [id number]  47y/o hispanic female established pt c/o breast and axillary discomfort.  She discussed with RN Cheri Guppy muscle pain bilateral chest just under shoulders, lateral pectoris muscles. Pain almost constantly present 2/10, worse at night and with exertion such as lifting, pushing, pulling, certain arm movements. Worst pain is 4/10. Describes pain as a burning sensation. Pain present for the past 2-3 months. Believed that it was related to work duties, but it is present and unrelieved on weekends when not performing similar motions at home.  History of mid back arthritis and imaging 2+ years ago.  Intermittent sharp mid back pain usually worst in middle of night but sometimes after working/lifting flares also.  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  Taking Tylenol prn maybe 4x/week. No ice/heat, other interventions.  She thinks nausea typically when she is having pain.  Nausea, worsening back pain and swelling armpits/breasts 2-3 months ago.  Menopause started last fall. Burning especially at night time at Night she has been experiencing hot flashes for past 6 months that have been slowly decreasing in intensity.  She reports hot flashes were worse when first started but now improving.  Denied loss of bowel bladder control, saddle paresthesias, arm/leg weakness, nipple discharge, breast rash, lumps or bumps in chest.     Review of Systems  Constitutional: Positive for diaphoresis. Negative for activity change, appetite change, chills, fatigue and fever.  HENT: Negative for trouble swallowing and voice change.   Eyes: Negative for photophobia and visual disturbance.  Respiratory: Negative for cough, shortness of breath and wheezing.   Cardiovascular: Negative for palpitations.  Gastrointestinal: Positive for nausea. Negative for abdominal distention, abdominal  pain, blood in stool, diarrhea and vomiting.  Endocrine: Negative for cold intolerance and heat intolerance.  Genitourinary: Negative for difficulty urinating and enuresis.  Musculoskeletal: Positive for back pain and myalgias. Negative for arthralgias, gait problem, joint swelling, neck pain and neck stiffness.  Skin: Negative for color change, pallor, rash and wound.  Allergic/Immunologic: Negative for food allergies.  Neurological: Negative for tremors, seizures, syncope, facial asymmetry, speech difficulty, weakness, light-headedness, numbness and headaches.  Hematological: Negative for adenopathy. Does not bruise/bleed easily.  Psychiatric/Behavioral: Positive for sleep disturbance. Negative for agitation and confusion.       Objective:   Physical Exam  Constitutional: She is oriented to person, place, and time. Vital signs are normal. She appears well-developed and well-nourished. She is active and cooperative.  Non-toxic appearance. She does not have a sickly appearance. She does not appear ill. No distress.  HENT:  Head: Normocephalic and atraumatic.  Right Ear: Hearing and external ear normal.  Left Ear: Hearing and external ear normal.  Nose: No mucosal edema, rhinorrhea, nose lacerations, sinus tenderness, nasal deformity, septal deviation or nasal septal hematoma. No epistaxis.  No foreign bodies. Right sinus exhibits no maxillary sinus tenderness and no frontal sinus tenderness. Left sinus exhibits no maxillary sinus tenderness and no frontal sinus tenderness.  Mouth/Throat: Uvula is midline, oropharynx is clear and moist and mucous membranes are normal. Mucous membranes are not pale, not dry and not cyanotic. She does not have dentures. No oral lesions. No trismus in the jaw. Normal dentition. No dental abscesses, uvula swelling, lacerations or dental caries. No oropharyngeal exudate, posterior oropharyngeal edema, posterior oropharyngeal erythema or tonsillar abscesses.  Bilateral  allergic shiners  Eyes: Pupils are  equal, round, and reactive to light. Conjunctivae, EOM and lids are normal. Right eye exhibits no chemosis, no discharge, no exudate and no hordeolum. No foreign body present in the right eye. Left eye exhibits no chemosis, no discharge, no exudate and no hordeolum. No foreign body present in the left eye. Right conjunctiva is not injected. Right conjunctiva has no hemorrhage. Left conjunctiva is not injected. Left conjunctiva has no hemorrhage. No scleral icterus. Right eye exhibits normal extraocular motion and no nystagmus. Left eye exhibits normal extraocular motion and no nystagmus. Right pupil is round and reactive. Left pupil is round and reactive. Pupils are equal.  Neck: Trachea normal, normal range of motion and phonation normal. Neck supple. No tracheal tenderness, no spinous process tenderness and no muscular tenderness present. No neck rigidity. No tracheal deviation, no edema, no erythema and normal range of motion present. No thyroid mass and no thyromegaly present.  Cardiovascular: Normal rate, regular rhythm, S1 normal, S2 normal, normal heart sounds and intact distal pulses. PMI is not displaced. Exam reveals no gallop, no distant heart sounds and no friction rub.  No murmur heard. Pulses:      Radial pulses are 2+ on the right side, and 2+ on the left side.  Pulmonary/Chest: Effort normal and breath sounds normal. No accessory muscle usage or stridor. No respiratory distress. She has no decreased breath sounds. She has no wheezes. She has no rhonchi. She has no rales. She exhibits tenderness. She exhibits no bony tenderness, no laceration, no crepitus, no edema, no deformity, no swelling and no retraction. Right breast exhibits tenderness. Right breast exhibits no skin change. Left breast exhibits tenderness. Left breast exhibits no skin change. No breast swelling, tenderness, discharge or bleeding. Breasts are symmetrical.    No cough observed in exam  room; spoke full sentences without difficulty  Abdominal: Soft. Normal appearance. She exhibits no distension, no fluid wave and no ascites. There is no rigidity and no guarding. Hernia confirmed negative in the ventral area.  Genitourinary: Pelvic exam was performed with patient supine.  Musculoskeletal: Normal range of motion. She exhibits no edema, tenderness or deformity.       Right shoulder: Normal.       Left shoulder: Normal.       Right elbow: Normal.      Left elbow: Normal.       Right wrist: Normal.       Left wrist: Normal.       Right hip: Normal.       Left hip: Normal.       Right knee: Normal.       Left knee: Normal.       Cervical back: Normal.       Thoracic back: Normal.       Lumbar back: Normal.       Right hand: Normal.       Left hand: Normal.  Full arom bilateral arms/legs/c/t/l-spine.  Patient able to touch toes.  Normal heel toe gait.  Quickly moves from sitting to supine to sitting to standing from chair to exam table .  Denied pain with flexion/extension/rotation and lateral bending of spine. In exam room.  Bilateral trapezius tight.  Lymphadenopathy:       Head (right side): No submental, no submandibular, no tonsillar, no preauricular, no posterior auricular and no occipital adenopathy present.       Head (left side): No submental, no submandibular, no tonsillar, no preauricular, no posterior auricular and no occipital adenopathy  present.    She has no cervical adenopathy.       Right cervical: No superficial cervical, no deep cervical and no posterior cervical adenopathy present.      Left cervical: No superficial cervical, no deep cervical and no posterior cervical adenopathy present.    She has no axillary adenopathy.       Right axillary: No lateral adenopathy present.       Left axillary: No lateral adenopathy present.      Right: No supraclavicular adenopathy present.       Left: No supraclavicular adenopathy present.  Neurological: She is alert  and oriented to person, place, and time. She has normal strength. She is not disoriented. She displays no atrophy and no tremor. No cranial nerve deficit or sensory deficit. She exhibits normal muscle tone. She displays no seizure activity. Coordination and gait normal. GCS eye subscore is 4. GCS verbal subscore is 5. GCS motor subscore is 6.  On/off exam table; in/out of chair without difficulty; gait sure and steady in hallway  Skin: Skin is warm, dry and intact. No abrasion, no bruising, no burn, no ecchymosis, no laceration, no lesion, no petechiae and no rash noted. She is not diaphoretic. No cyanosis or erythema. No pallor. Nails show no clubbing.  Psychiatric: She has a normal mood and affect. Her speech is normal and behavior is normal. Judgment and thought content normal. Cognition and memory are normal.  Nursing note and vitals reviewed.   Reviewed results in care everywhere/chart review Last mammogram dense breast tissue recommend repeat in 1 year and thyroid US Oct 2018 stable solitary thyroid nodule.  Thoracic spine imaging decreased disc height and chest CT  2017      Assessment & Plan:  A-breast pain, thoracic back pain and hot flashes  P-Taking tylenol prn already discussed max dosing 1000mg  po qid.   Trial motrin 800mg  po BID x 2 weeks #30 RF0 take with food given to patient from [facility].  Consider repeat thyroid US especially if changes in her TFT results, dysphagia, changes in her voice.  Discussed if thyroid hormones out of range could cause hot flashes, myalgias also. Discussed menopause symptoms with patient.  Discussed could be PMDD even though she is no longer menstruating because ovaries are still producing hormones at decreased level.  When having hot flashes do not sit in wet clothes/sheets can cause rash. Continue monthly self breast exams. F/U Dr Lynnae Sandhoff sooner if breast lump, axillary lumps or chest lumps, nipple discharge, dysphasia, dysphagia, voice changes.  Consider  breast US.  Reminded patient mammogram will be due Oct 2019.  Patient does not want thyroid or breast US ordered today.  Discussed labs with patient she wants to proceed to check her thyroid, blood sugar.  Exec panel with HgbA1C and TFTs fasting scheduled for Friday at 0830 with RN Cheri Guppy  Because she has already eaten full breakfast this am.  Discussed daily stretching and heat along with tylenol for arthritis typically works the best.Discussed pain can be associated with nausea especially acute.  Ensure eating snack or meal when taking tylenol/motrin.  Consider decreasing caffeine because if breast discomfort hormonally related caffeine and some foods could worsen symptoms.  If arm/leg weakness, loss of bowel bladder control, saddle paresthesias with her back pain seek follow up re-evaluation same day.  Exitcare handout printed and given to patient on osteoarthritis, thoracic back strain rehab exercises, breast pain, thyroid nodule, perimenopause and menopause.  Patient verbalized understanding information/instructions, agreed with  plan of care and had no further questions at this time.

## 2017-05-01 ENCOUNTER — Ambulatory Visit: Payer: Self-pay | Admitting: *Deleted

## 2017-05-01 DIAGNOSIS — Z0000 Encounter for general adult medical examination without abnormal findings: Secondary | ICD-10-CM

## 2017-05-01 DIAGNOSIS — Z8639 Personal history of other endocrine, nutritional and metabolic disease: Secondary | ICD-10-CM

## 2017-05-01 DIAGNOSIS — R232 Flushing: Secondary | ICD-10-CM

## 2017-05-01 NOTE — Progress Notes (Signed)
Labs drawn per verbal orders from 04/28/17 OV with Gerarda Fraction, NP. Appt made with RN for result review in clinic Monday am.

## 2017-05-02 LAB — CMP12+LP+TP+TSH+6AC+CBC/D/PLT
ALK PHOS: 70 IU/L (ref 39–117)
ALT: 10 IU/L (ref 0–32)
AST: 19 IU/L (ref 0–40)
Albumin/Globulin Ratio: 1.6 (ref 1.2–2.2)
Albumin: 4.5 g/dL (ref 3.5–5.5)
BASOS: 0 %
BUN/Creatinine Ratio: 10 (ref 9–23)
BUN: 8 mg/dL (ref 6–24)
Basophils Absolute: 0 10*3/uL (ref 0.0–0.2)
Bilirubin Total: 0.4 mg/dL (ref 0.0–1.2)
CALCIUM: 9.2 mg/dL (ref 8.7–10.2)
CHLORIDE: 102 mmol/L (ref 96–106)
CHOL/HDL RATIO: 2.6 ratio (ref 0.0–4.4)
Cholesterol, Total: 210 mg/dL — ABNORMAL HIGH (ref 100–199)
Creatinine, Ser: 0.78 mg/dL (ref 0.57–1.00)
EOS (ABSOLUTE): 0.1 10*3/uL (ref 0.0–0.4)
EOS: 2 %
Estimated CHD Risk: <0.5 times avg. (ref 0.0–1.0)
Free Thyroxine Index: 1.3 (ref 1.2–4.9)
GFR calc non Af Amer: 90 mL/min/{1.73_m2} (ref >59)
GFR, EST AFRICAN AMERICAN: 104 mL/min/{1.73_m2} (ref >59)
GGT: 16 IU/L (ref 0–60)
GLOBULIN, TOTAL: 2.8 g/dL (ref 1.5–4.5)
Glucose: 75 mg/dL (ref 65–99)
HDL: 80 mg/dL (ref >39)
HEMATOCRIT: 40.5 % (ref 34.0–46.6)
HEMOGLOBIN: 13.1 g/dL (ref 11.1–15.9)
IMMATURE GRANS (ABS): 0 10*3/uL (ref 0.0–0.1)
Immature Granulocytes: 0 %
Iron: 92 ug/dL (ref 27–159)
LDH: 215 IU/L (ref 119–226)
LDL CALC: 109 mg/dL — AB (ref 0–99)
LYMPHS: 24 %
Lymphocytes Absolute: 1.7 10*3/uL (ref 0.7–3.1)
MCH: 30.3 pg (ref 26.6–33.0)
MCHC: 32.3 g/dL (ref 31.5–35.7)
MCV: 94 fL (ref 79–97)
MONOCYTES: 7 %
Monocytes Absolute: 0.5 10*3/uL (ref 0.1–0.9)
Neutrophils Absolute: 4.7 10*3/uL (ref 1.4–7.0)
Neutrophils: 67 %
POTASSIUM: 4.3 mmol/L (ref 3.5–5.2)
Phosphorus: 3.7 mg/dL (ref 2.5–4.5)
Platelets: 340 10*3/uL (ref 150–379)
RBC: 4.32 x10E6/uL (ref 3.77–5.28)
RDW: 14.1 % (ref 12.3–15.4)
SODIUM: 140 mmol/L (ref 134–144)
T3 Uptake Ratio: 24 % (ref 24–39)
T4, Total: 5.4 ug/dL (ref 4.5–12.0)
TRIGLYCERIDES: 106 mg/dL (ref 0–149)
TSH: 0.714 u[IU]/mL (ref 0.450–4.500)
Total Protein: 7.3 g/dL (ref 6.0–8.5)
Uric Acid: 3.4 mg/dL (ref 2.5–7.1)
VLDL Cholesterol Cal: 21 mg/dL (ref 5–40)
WBC: 6.9 10*3/uL (ref 3.4–10.8)

## 2017-05-02 LAB — HGB A1C W/O EAG: Hgb A1c MFr Bld: 5.4 % (ref 4.8–5.6)

## 2017-05-05 NOTE — Progress Notes (Signed)
Late entry: Results reviewed with pt 05/04/17. All WNL with exceptions of Total chol and LDL elevated, previous wnl. Discussed diet and exercise recommendations for lipid improvement, health maintenance. Copy of labs given to pt. She is relieved that thyroid levels are wnl and sx are likely only menopausal. Denies further needs/concerns at this time.

## 2017-06-16 ENCOUNTER — Encounter: Payer: Self-pay | Admitting: Registered Nurse

## 2017-06-16 ENCOUNTER — Ambulatory Visit: Payer: Self-pay | Admitting: Registered Nurse

## 2017-06-16 VITALS — BP 112/72 | HR 71 | Temp 98.2°F

## 2017-06-16 DIAGNOSIS — B9789 Other viral agents as the cause of diseases classified elsewhere: Principal | ICD-10-CM

## 2017-06-16 DIAGNOSIS — J069 Acute upper respiratory infection, unspecified: Secondary | ICD-10-CM

## 2017-06-16 MED ORDER — SALINE SPRAY 0.65 % NA SOLN: 2.0000 | NASAL | 0 refills | Status: DC

## 2017-06-16 NOTE — Progress Notes (Signed)
Subjective:    Patient ID: Denise Gordon, female    DOB: 12-02-1970, 47 y.o.   MRN: [id number]  47y/o hispanic female established patient here for evaluation sore throat, post nasal drip, congestion.  + sick contacts at work.  Tried OTC cough and cold, phenylephrine with some relief.  Not taking her allergy medications.     Review of Systems  Constitutional: Negative for activity change, appetite change, chills, diaphoresis, fatigue, fever and unexpected weight change.  HENT: Positive for congestion, postnasal drip, rhinorrhea and sore throat. Negative for dental problem, drooling, ear discharge, ear pain, facial swelling, hearing loss, mouth sores, nosebleeds, sinus pressure, sinus pain, sneezing, tinnitus, trouble swallowing and voice change.   Eyes: Negative for photophobia, pain, discharge, redness, itching and visual disturbance.  Respiratory: Positive for cough. Negative for choking, chest tightness, shortness of breath, wheezing and stridor.   Cardiovascular: Negative for chest pain.  Gastrointestinal: Negative for blood in stool, constipation, diarrhea, nausea and vomiting.  Endocrine: Negative for cold intolerance and heat intolerance.  Genitourinary: Negative for difficulty urinating, dysuria and hematuria.  Musculoskeletal: Negative for arthralgias, back pain, gait problem, joint swelling, myalgias, neck pain and neck stiffness.  Skin: Negative for color change, pallor, rash and wound.  Allergic/Immunologic: Positive for environmental allergies. Negative for food allergies.  Neurological: Negative for dizziness, tremors, seizures, syncope, facial asymmetry, speech difficulty, weakness, light-headedness, numbness and headaches.  Hematological: Negative for adenopathy. Does not bruise/bleed easily.  Psychiatric/Behavioral: Negative for agitation, confusion and sleep disturbance.       Objective:   Physical Exam  Constitutional: She is oriented to person, place, and time. Vital  signs are normal. She appears well-developed and well-nourished. She is active and cooperative.  Non-toxic appearance. She does not have a sickly appearance. She does not appear ill. No distress.  HENT:  Head: Normocephalic and atraumatic.  Right Ear: Hearing, external ear and ear canal normal. No drainage, swelling or tenderness. A middle ear effusion is present.  Left Ear: Hearing, external ear and ear canal normal. No drainage, swelling or tenderness. A middle ear effusion is present.  Nose: Mucosal edema and rhinorrhea present. No nose lacerations, sinus tenderness, nasal deformity, septal deviation or nasal septal hematoma. No epistaxis.  No foreign bodies. Right sinus exhibits no maxillary sinus tenderness and no frontal sinus tenderness. Left sinus exhibits no maxillary sinus tenderness and no frontal sinus tenderness.  Mouth/Throat: Uvula is midline and mucous membranes are normal. Mucous membranes are not pale, not dry and not cyanotic. She does not have dentures. No oral lesions. No trismus in the jaw. Normal dentition. No dental abscesses, uvula swelling, lacerations or dental caries. Posterior oropharyngeal edema and posterior oropharyngeal erythema present. No oropharyngeal exudate or tonsillar abscesses. No tonsillar exudate.  Bilateral allergic shiners; bilateral TMs air fluid level clear; cobblestoning posterior pharynx; bilateral nasal turbinates edema/erythema clear discharge  Eyes: Pupils are equal, round, and reactive to light. Conjunctivae, EOM and lids are normal. Right eye exhibits no chemosis, no discharge, no exudate and no hordeolum. No foreign body present in the right eye. Left eye exhibits no chemosis, no discharge, no exudate and no hordeolum. No foreign body present in the left eye. Right conjunctiva is not injected. Right conjunctiva has no hemorrhage. Left conjunctiva is not injected. Left conjunctiva has no hemorrhage. No scleral icterus. Right eye exhibits normal  extraocular motion and no nystagmus. Left eye exhibits normal extraocular motion and no nystagmus. Right pupil is round and reactive. Left pupil is round and reactive.  Pupils are equal.  Neck: Trachea normal, normal range of motion and phonation normal. Neck supple. No tracheal tenderness, no spinous process tenderness and no muscular tenderness present. No neck rigidity. No tracheal deviation, no edema, no erythema and normal range of motion present. No thyroid mass and no thyromegaly present.  Cardiovascular: Normal rate, regular rhythm, S1 normal, S2 normal, normal heart sounds and intact distal pulses. PMI is not displaced. Exam reveals no gallop, no distant heart sounds and no friction rub.  No murmur heard. Pulmonary/Chest: Effort normal and breath sounds normal. No accessory muscle usage or stridor. No respiratory distress. She has no decreased breath sounds. She has no wheezes. She has no rhonchi. She has no rales. She exhibits no tenderness.  No cough observed in exam room; spoke full sentences without difficulty  Abdominal: Soft. Normal appearance. She exhibits no distension and no fluid wave. There is no rigidity and no guarding.  Musculoskeletal: She exhibits no edema or tenderness.       Right shoulder: Normal.       Left shoulder: Normal.       Right elbow: Normal.      Left elbow: Normal.       Right hip: Normal.       Left hip: Normal.       Right knee: Normal.       Left knee: Normal.       Cervical back: Normal.       Thoracic back: Normal.       Lumbar back: Normal.       Right hand: Normal.       Left hand: Normal.  Lymphadenopathy:       Head (right side): No submental, no submandibular, no tonsillar, no preauricular, no posterior auricular and no occipital adenopathy present.       Head (left side): No submental, no submandibular, no tonsillar, no preauricular, no posterior auricular and no occipital adenopathy present.    She has no cervical adenopathy.       Right  cervical: No superficial cervical, no deep cervical and no posterior cervical adenopathy present.      Left cervical: No superficial cervical, no deep cervical and no posterior cervical adenopathy present.  Neurological: She is alert and oriented to person, place, and time. She has normal strength. She is not disoriented. She displays no atrophy and no tremor. No cranial nerve deficit or sensory deficit. She exhibits normal muscle tone. She displays no seizure activity. Coordination and gait normal. GCS eye subscore is 4. GCS verbal subscore is 5. GCS motor subscore is 6.  On/off exam table; in/out of chair without difficulty; gait sure and steady in hallway  Skin: Skin is warm, dry and intact. Capillary refill takes less than 2 seconds. No abrasion, no bruising, no burn, no ecchymosis, no laceration, no lesion, no petechiae and no rash noted. She is not diaphoretic. No cyanosis or erythema. No pallor. Nails show no clubbing.  Psychiatric: She has a normal mood and affect. Her speech is normal and behavior is normal. Judgment and thought content normal. She is not actively hallucinating. Cognition and memory are normal. She is attentive.  Vitals reviewed.         Assessment & Plan:  A-viral uri  P-phenylephrine 10mg  po every 6 hours prn rhinitis given 8 UD from clinic stock.  Discussed can cause insomnia or drowsiness for some patients.   Restart flonase 1 spray each nostril BID at home, saline 2 sprays each nostril  q2h wa prn congestion at home and given 1 bottle from clinic stock for use at work.  If no improvement with 48 hours of saline and flonase use follow up in clinic Thursday for re-evaluation.  Denied personal or family history of ENT cancer.  Shower BID especially prior to bed. No evidence of systemic bacterial infection, non toxic and well hydrated.  I do not see where any further testing or imaging is necessary at this time.  Honey with lemon or salty soups to help soothe throat.   Tylenol 1000mg  po QID prn pain and/or cough drops po q2h wa prn cough/sore throat.  Popsicles/smoothies fruit based without dairy as dairy increases mucous production.   I will suggest supportive care, rest, good hygiene and encourage the patient to take adequate fluids.  The patient is to return to clinic or EMERGENCY ROOM if symptoms worsen or change significantly.  Exitcare handout on viral uri, sinusitis and sinus rinse.  Patient verbalized agreement and understanding of treatment plan and had no further questions at this time.   P2:  Hand washing and cover cough  Patient may use normal saline nasal spray 2 sprays each nostril q2h wa as needed. flonase 67mcg 1 spray each nostril BID   Patient denied personal or family history of ENT cancer.  OTC antihistamine of choice claritin10mg  po daily.  Avoid triggers if possible.  Shower prior to bedtime if exposed to triggers.  If allergic dust/dust mites recommend mattress/pillow covers/encasements; washing linens, vacuuming, sweeping, dusting weekly.  Call or return to clinic as needed if these symptoms worsen or fail to improve as anticipated.   Exitcare handout on allergic rhinitis and sinus rinset.  Patient verbalized understanding of instructions, agreed with plan of care and had no further questions at this time.  P2:  Avoidance and hand washing.

## 2017-06-16 NOTE — Patient Instructions (Signed)
Allergic Rhinitis, Adult Allergic rhinitis is an allergic reaction that affects the mucous membrane inside the nose. It causes sneezing, a runny or stuffy nose, and the feeling of mucus going down the back of the throat (postnasal drip). Allergic rhinitis can be mild to severe. There are two types of allergic rhinitis:  Seasonal. This type is also called hay fever. It happens only during certain seasons.  Perennial. This type can happen at any time of the year.  What are the causes? This condition happens when the body's defense system (immune system) responds to certain harmless substances called allergens as though they were germs.  Seasonal allergic rhinitis is triggered by pollen, which can come from grasses, trees, and weeds. Perennial allergic rhinitis may be caused by:  House dust mites.  Pet dander.  Mold spores.  What are the signs or symptoms? Symptoms of this condition include:  Sneezing.  Runny or stuffy nose (nasal congestion).  Postnasal drip.  Itchy nose.  Tearing of the eyes.  Trouble sleeping.  Daytime sleepiness.  How is this diagnosed? This condition may be diagnosed based on:  Your medical history.  A physical exam.  Tests to check for related conditions, such as: ? Asthma. ? Pink eye. ? Ear infection. ? Upper respiratory infection.  Tests to find out which allergens trigger your symptoms. These may include skin or blood tests.  How is this treated? There is no cure for this condition, but treatment can help control symptoms. Treatment may include:  Taking medicines that block allergy symptoms, such as antihistamines. Medicine may be given as a shot, nasal spray, or pill.  Avoiding the allergen.  Desensitization. This treatment involves getting ongoing shots until your body becomes less sensitive to the allergen. This treatment may be done if other treatments do not help.  If taking medicine and avoiding the allergen does not work, new,  stronger medicines may be prescribed.  Follow these instructions at home:  Find out what you are allergic to. Common allergens include smoke, dust, and pollen.  Avoid the things you are allergic to. These are some things you can do to help avoid allergens: ? Replace carpet with wood, tile, or vinyl flooring. Carpet can trap dander and dust. ? Do not smoke. Do not allow smoking in your home. ? Change your heating and air conditioning filter at least once a month. ? During allergy season:  Keep windows closed as much as possible.  Plan outdoor activities when pollen counts are lowest. This is usually during the evening hours.  When coming indoors, change clothing and shower before sitting on furniture or bedding.  Take over-the-counter and prescription medicines only as told by your health care provider.  Keep all follow-up visits as told by your health care provider. This is important. Contact a health care provider if:  You have a fever.  You develop a persistent cough.  You make whistling sounds when you breathe (you wheeze).  Your symptoms interfere with your normal daily activities. Get help right away if:  You have shortness of breath. Summary  This condition can be managed by taking medicines as directed and avoiding allergens.  Contact your health care provider if you develop a persistent cough or fever.  During allergy season, keep windows closed as much as possible. This information is not intended to replace advice given to you by your health care provider. Make sure you discuss any questions you have with your health care provider. Document Released: 09/24/2000 Document Revised: 02/07/2016  Document Reviewed: 02/07/2016 Elsevier Interactive Patient Education  2018 [place]. Sinus Rinse What is a sinus rinse? A sinus rinse is a simple home treatment that is used to rinse your sinuses with a sterile mixture of salt and water (saline solution). Sinuses are  air-filled spaces in your skull behind the bones of your face and forehead that open into your nasal cavity. You will use the following:  Saline solution.  Neti pot or spray bottle. This releases the saline solution into your nose and through your sinuses. Neti pots and spray bottles can be purchased at [employer], a health food store, or online.  When would I do a sinus rinse? A sinus rinse can help to clear mucus, dirt, dust, or pollen from the nasal cavity. You may do a sinus rinse when you have a cold, a virus, nasal allergy symptoms, a sinus infection, or stuffiness in the nose or sinuses. If you are considering a sinus rinse:  Ask your child's health care provider before performing a sinus rinse on your child.  Do not do a sinus rinse if you have had ear or nasal surgery, ear infection, or blocked ears.  How do I do a sinus rinse?  Wash your hands.  Disinfect your device according to the directions provided and then dry it.  Use the solution that comes with your device or one that is sold separately in stores. Follow the mixing directions on the package.  Fill your device with the amount of saline solution as directed by the device instructions.  Stand over a sink and tilt your head sideways over the sink.  Place the spout of the device in your upper nostril (the one closer to the ceiling).  Gently pour or squeeze the saline solution into the nasal cavity. The liquid should drain to the lower nostril if you are not overly congested.  Gently blow your nose. Blowing too hard may cause ear pain.  Repeat in the other nostril.  Clean and rinse your device with clean water and then air-dry it. Are there risks of a sinus rinse? Sinus rinse is generally very safe and effective. However, there are a few risks, which include:  A burning sensation in the sinuses. This may happen if you do not make the saline solution as directed. Make sure to follow all directions when  making the saline solution.  Infection from contaminated water. This is rare, but possible.  Nasal irritation.  This information is not intended to replace advice given to you by your health care provider. Make sure you discuss any questions you have with your health care provider. Document Released: 07/27/2013 Document Revised: 11/27/2015 Document Reviewed: 05/17/2013 Elsevier Interactive Patient Education  2017 [place]. Viral Respiratory Infection A respiratory infection is an illness that affects part of the respiratory system, such as the lungs, nose, or throat. Most respiratory infections are caused by either viruses or bacteria. A respiratory infection that is caused by a virus is called a viral respiratory infection. Common types of viral respiratory infections include:  A cold.  The flu (influenza).  A respiratory syncytial virus (RSV) infection.  How do I know if I have a viral respiratory infection? Most viral respiratory infections cause:  A stuffy or runny nose.  Yellow or green nasal discharge.  A cough.  Sneezing.  Fatigue.  Achy muscles.  A sore throat.  Sweating or chills.  A fever.  A headache.  How are viral respiratory infections treated? If influenza is  diagnosed early, it may be treated with an antiviral medicine that shortens the length of time a person has symptoms. Symptoms of viral respiratory infections may be treated with over-the-counter and prescription medicines, such as:  Expectorants. These make it easier to cough up mucus.  Decongestant nasal sprays.  Health care providers do not prescribe antibiotic medicines for viral infections. This is because antibiotics are designed to kill bacteria. They have no effect on viruses. How do I know if I should stay home from work or school? To avoid exposing others to your respiratory infection, stay home if you have:  A fever.  A persistent cough.  A sore throat.  A runny  nose.  Sneezing.  Muscles aches.  Headaches.  Fatigue.  Weakness.  Chills.  Sweating.  Nausea.  Follow these instructions at home:  Rest as much as possible.  Take over-the-counter and prescription medicines only as told by your health care provider.  Drink enough fluid to keep your urine clear or pale yellow. This helps prevent dehydration and helps loosen up mucus.  Gargle with a salt-water mixture 3-4 times per day or as needed. To make a salt-water mixture, completely dissolve -1 tsp of salt in 1 cup of warm water.  Use nose drops made from salt water to ease congestion and soften raw skin around your nose.  Do not drink alcohol.  Do not use tobacco products, including cigarettes, chewing tobacco, and e-cigarettes. If you need help quitting, ask your health care provider. Contact a health care provider if:  Your symptoms last for 10 days or longer.  Your symptoms get worse over time.  You have a fever.  You have severe sinus pain in your face or forehead.  The glands in your jaw or neck become very swollen. Get help right away if:  You feel pain or pressure in your chest.  You have shortness of breath.  You faint or feel like you will faint.  You have severe and persistent vomiting.  You feel confused or disoriented. This information is not intended to replace advice given to you by your health care provider. Make sure you discuss any questions you have with your health care provider. Document Released: 10/09/2004 Document Revised: 06/07/2015 Document Reviewed: 06/07/2014 Elsevier Interactive Patient Education  [employer].

## 2017-06-22 ENCOUNTER — Ambulatory Visit: Payer: Self-pay | Admitting: Family Medicine

## 2017-12-07 ENCOUNTER — Ambulatory Visit
Admission: RE | Admit: 2017-12-07 | Discharge: 2017-12-07 | Disposition: A | Discharge status: Home / Self Care | Payer: Self-pay | Source: Ambulatory Visit | Attending: Oncology | Admitting: Oncology

## 2017-12-07 ENCOUNTER — Ambulatory Visit: Payer: Self-pay | Attending: Oncology

## 2017-12-07 VITALS — BP 121/78 | HR 60 | Temp 98.2°F | Ht 64.0 in | Wt 117.0 lb

## 2017-12-07 DIAGNOSIS — Z0000 Encounter for general adult medical examination without abnormal findings: Secondary | ICD-10-CM | POA: Insufficient documentation

## 2017-12-07 NOTE — Progress Notes (Signed)
  Subjective:     Patient ID: Denise Gordon, female   DOB: 01/29/1970, 47 y.o.   MRN: [id number]  HPI   Review of Systems     Objective:   Physical Exam  Pulmonary/Chest: Right breast exhibits no inverted nipple, no mass, no nipple discharge, no skin change and no tenderness. Left breast exhibits no inverted nipple, no mass, no nipple discharge, no skin change and no tenderness. Breasts are symmetrical.       Assessment:     47 year old hispanic patient presents for BCCCP clinic visit.  She is fluent.  Patient screened, and meets BCCCP eligibility.  Patient does not have insurance, Medicare or Medicaid.  Handout given on Affordable Care Act.  Instructed patient on breast self awareness using teach back method.  Clinical breast exam unremarkable. No mass or lump palpated.  Patient works for [employer] ,but is trying to get on full-time.  Drives to [place] 3 days per week.   Risk Assessment    Risk Scores      12/07/2017   Last edited by: Orson Slick, CMA   5-year risk: 0.5 %   Lifetime risk: 4.7 %             Plan:     Sent for bilateral screening mammogram.

## 2017-12-13 NOTE — Progress Notes (Signed)
Letter mailed from [facility] to notify of normal mammogram results.  Patient to return in one year for annual screening.  Copy to [facility]. 

## 2018-03-20 IMAGING — CR DG CHEST 2V
2 series · 2 of 2 positions shown · non-contrast
Comparison: None.

CLINICAL DATA: Bilateral leg pain, mid sternal chest pain

EXAM:
CHEST  2 VIEW

[chest pa]
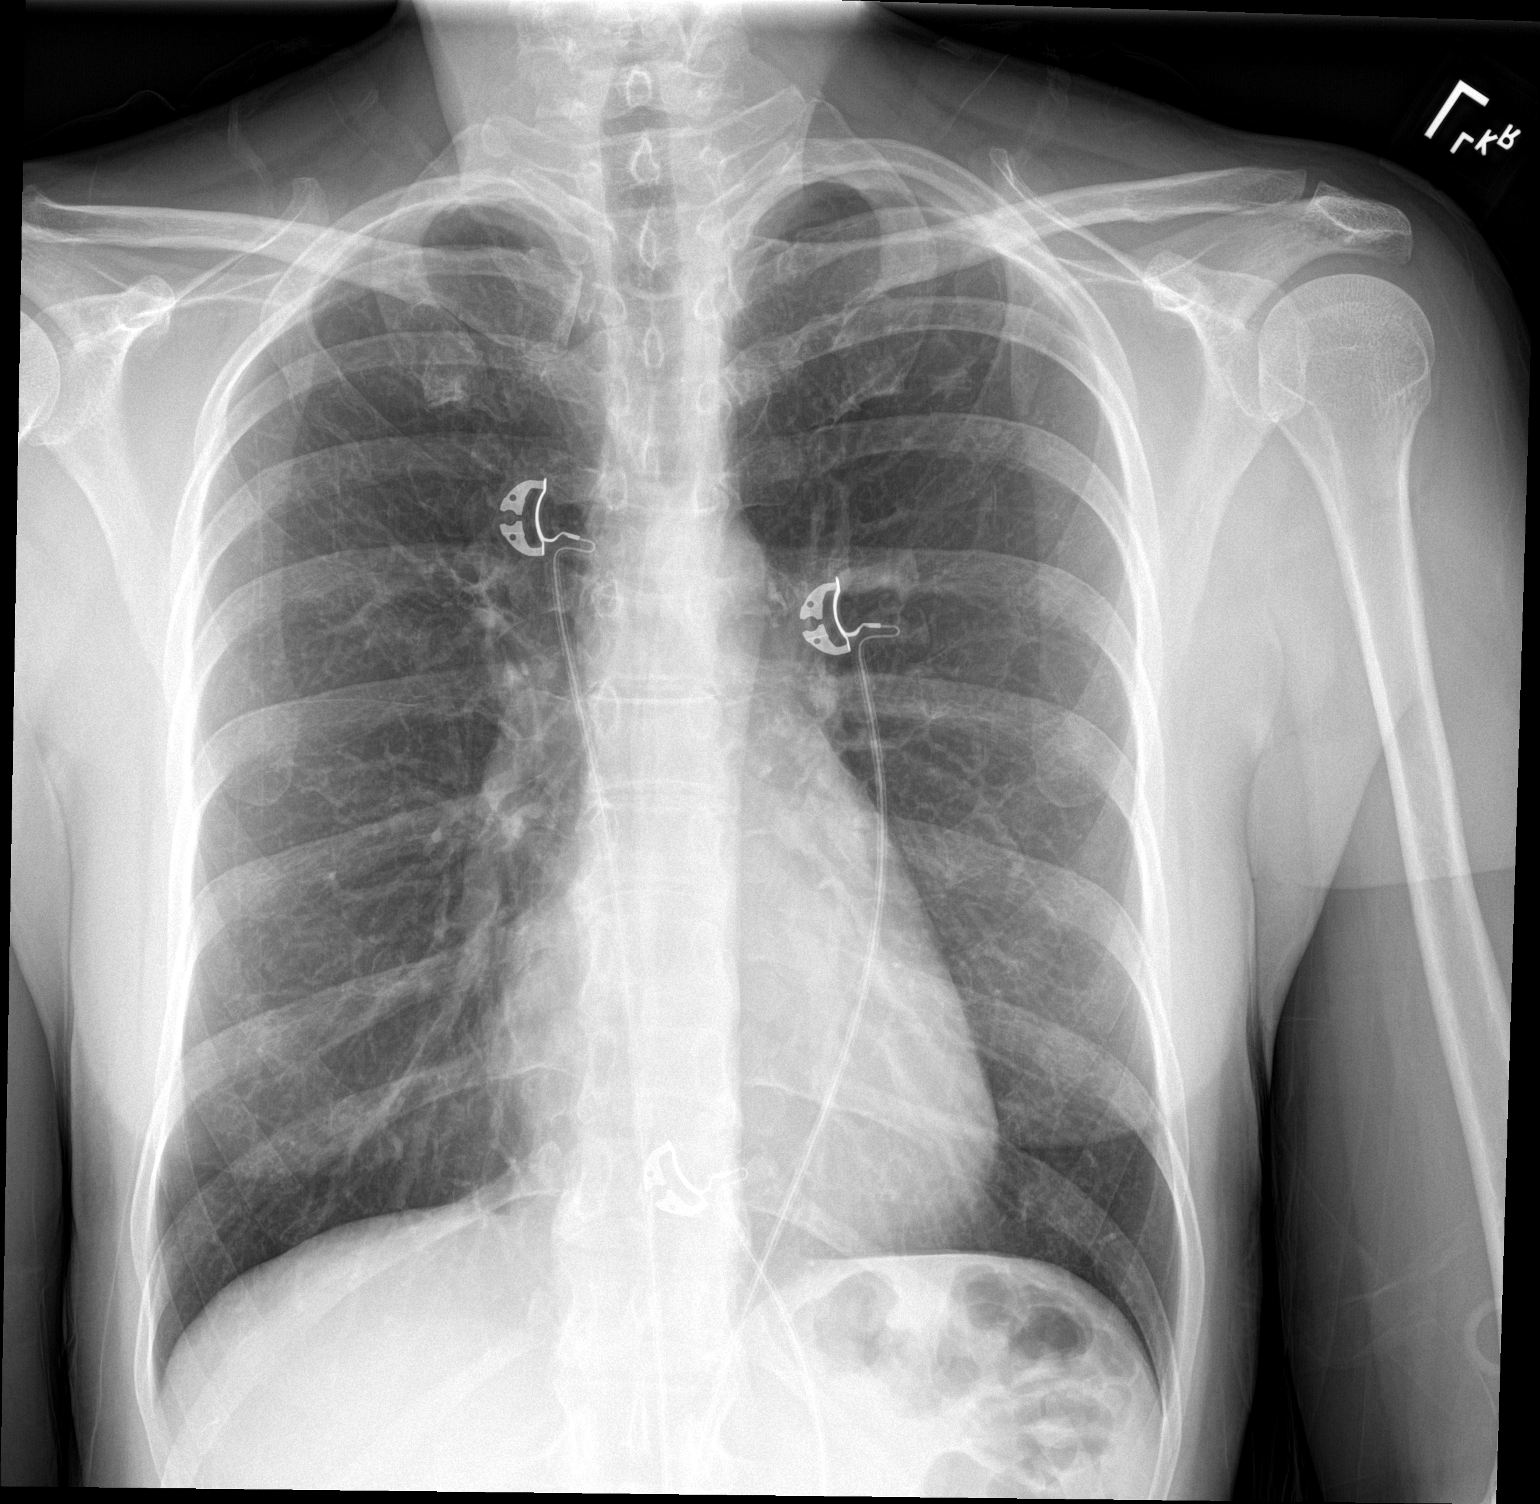

[chest lat]
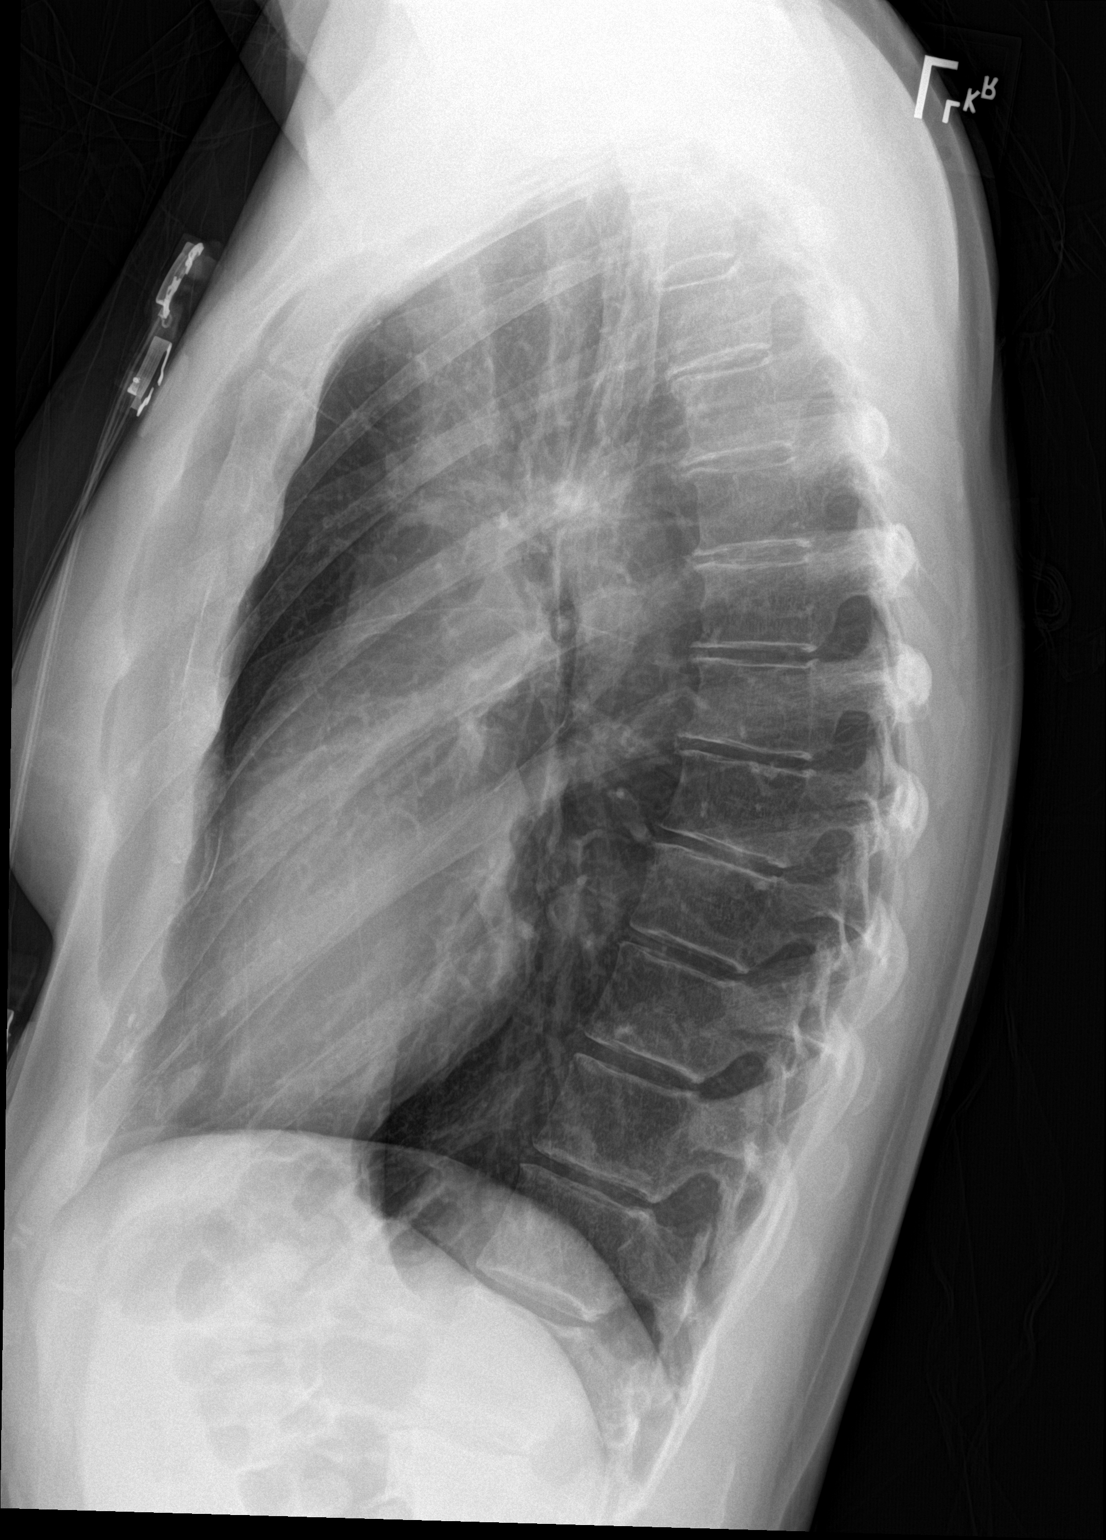

[2 of 2 positions shown; findings below may reference images not displayed]

FINDINGS: Cardiomediastinal silhouette is unremarkable. Mild hyperinflation.
No infiltrate or pulmonary edema. Mild degenerative changes mid
thoracic spine. There is vague irregular nodular density in left
upper lobe measures about 7 mm. Further correlation with CT scan of
the chest is recommended to exclude a lung nodule.
IMPRESSION: Mild hyperinflation. No infiltrate or pulmonary edema. Mild
degenerative changes mid thoracic spine. There is vague irregular
nodular density in left upper lobe measures about 7 mm. Further
correlation with CT scan of the chest is recommended to exclude a
lung nodule.

## 2018-04-03 IMAGING — DX DG THORACIC SPINE 3V
3 series · 3 of 3 positions shown · non-contrast
Comparison: 10/07/2015.

CLINICAL DATA: Thoracic spine pain.

EXAM:
THORACIC SPINE - 3 VIEWS

[t-spine ap]
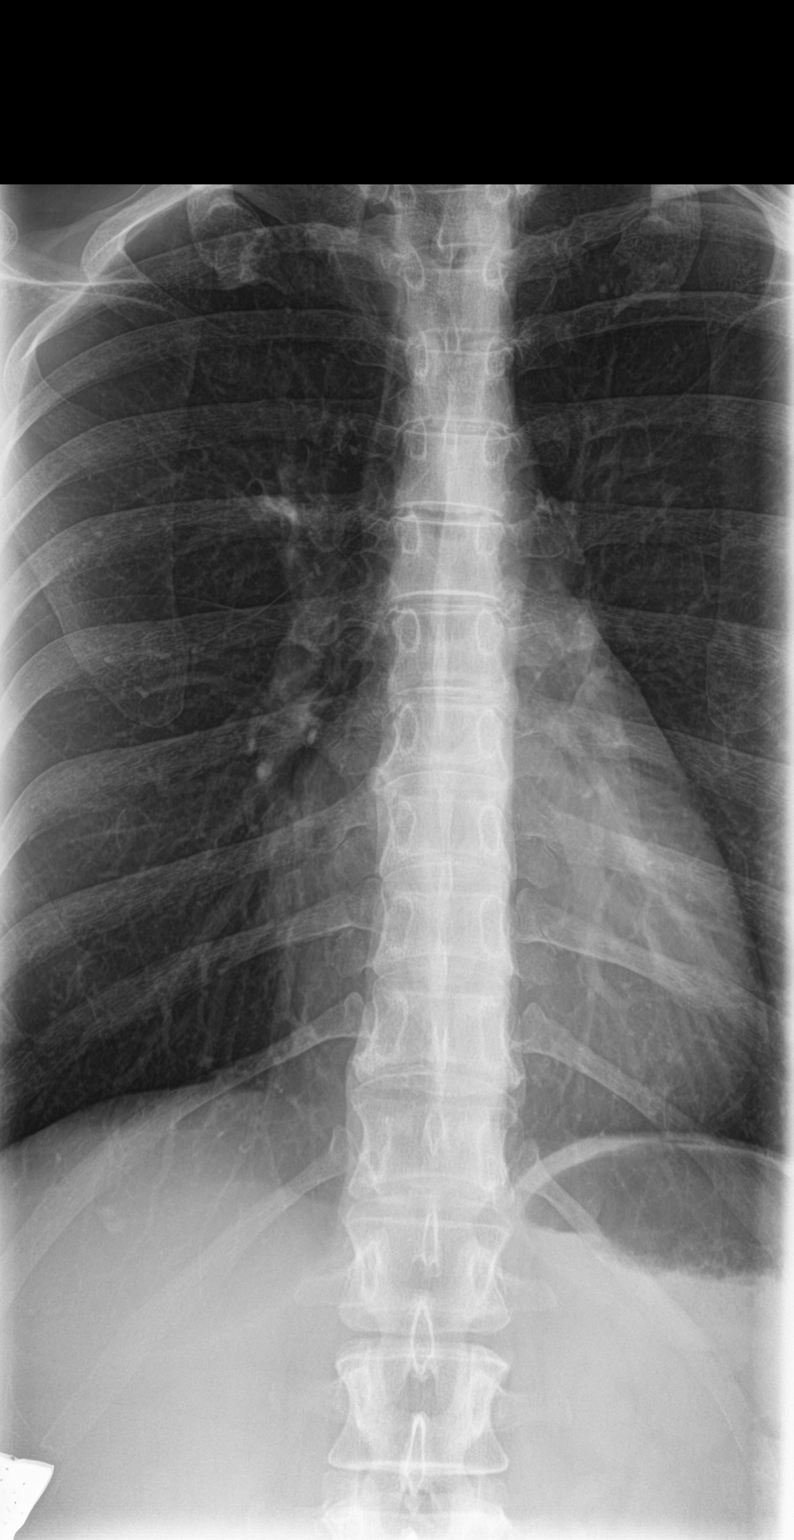

[t-spine lat]
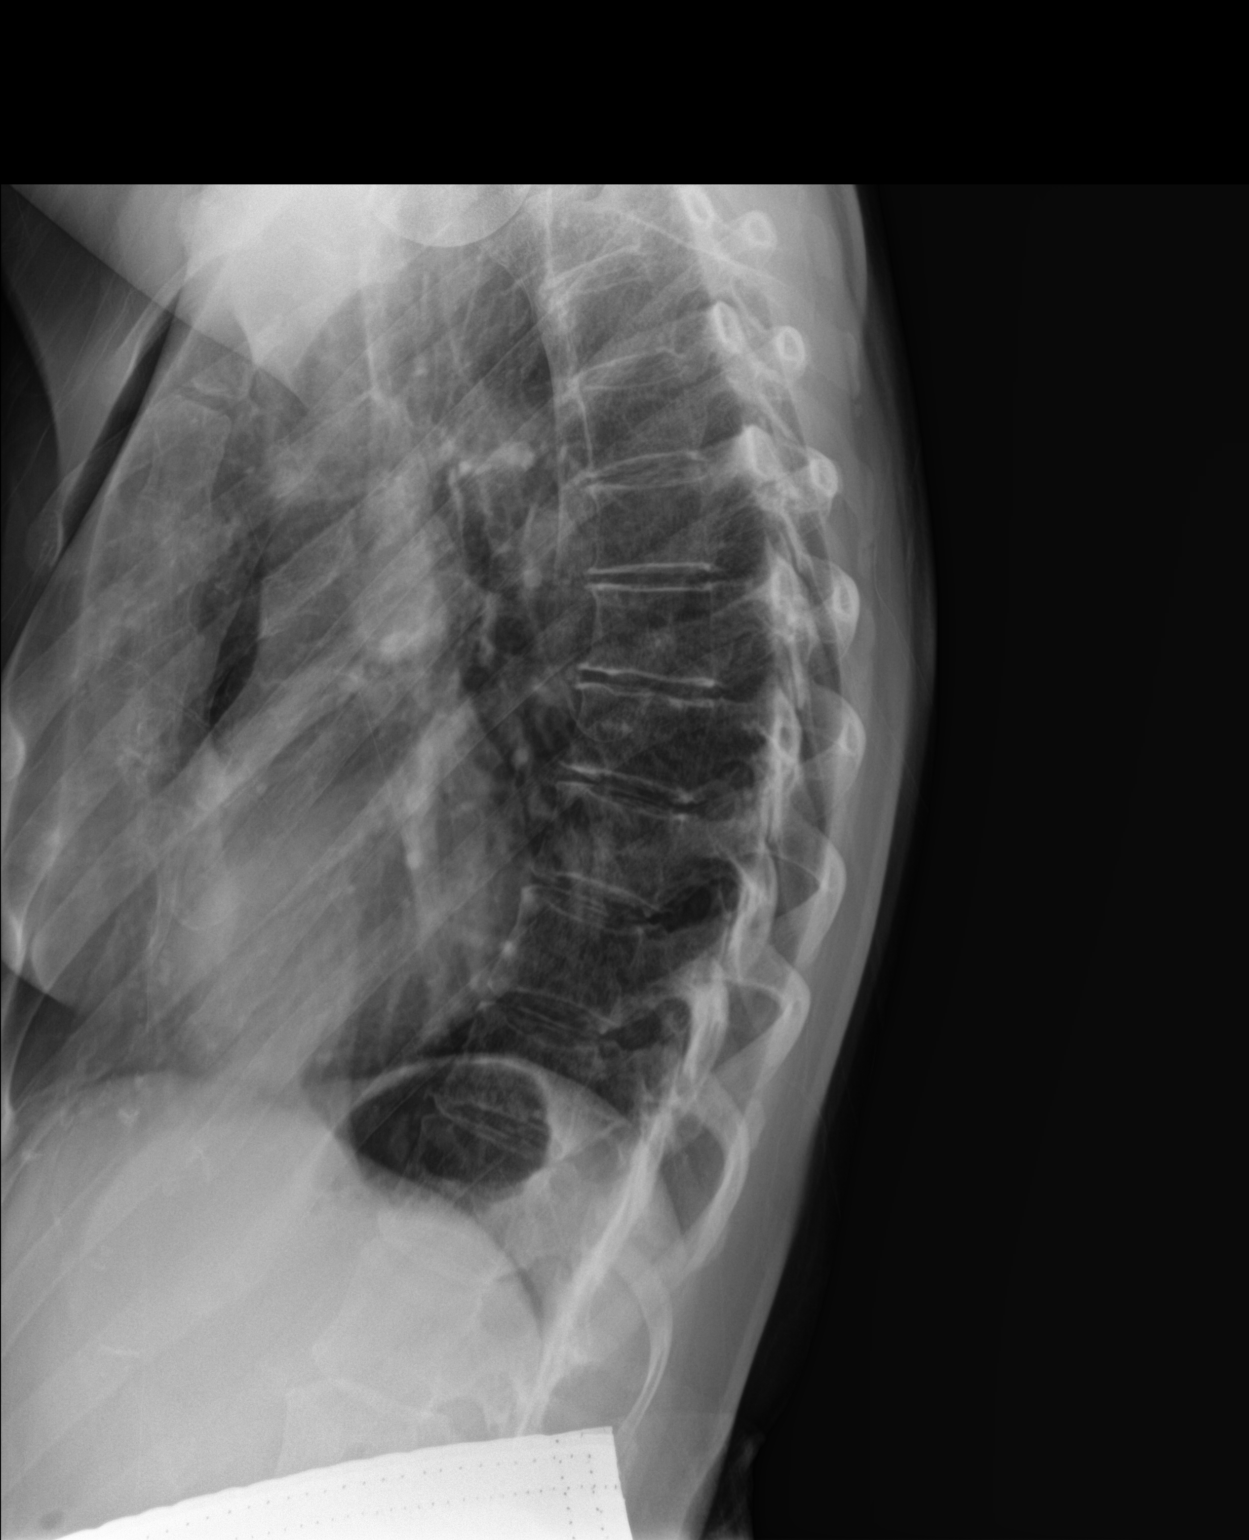

[t-spine swimmers]
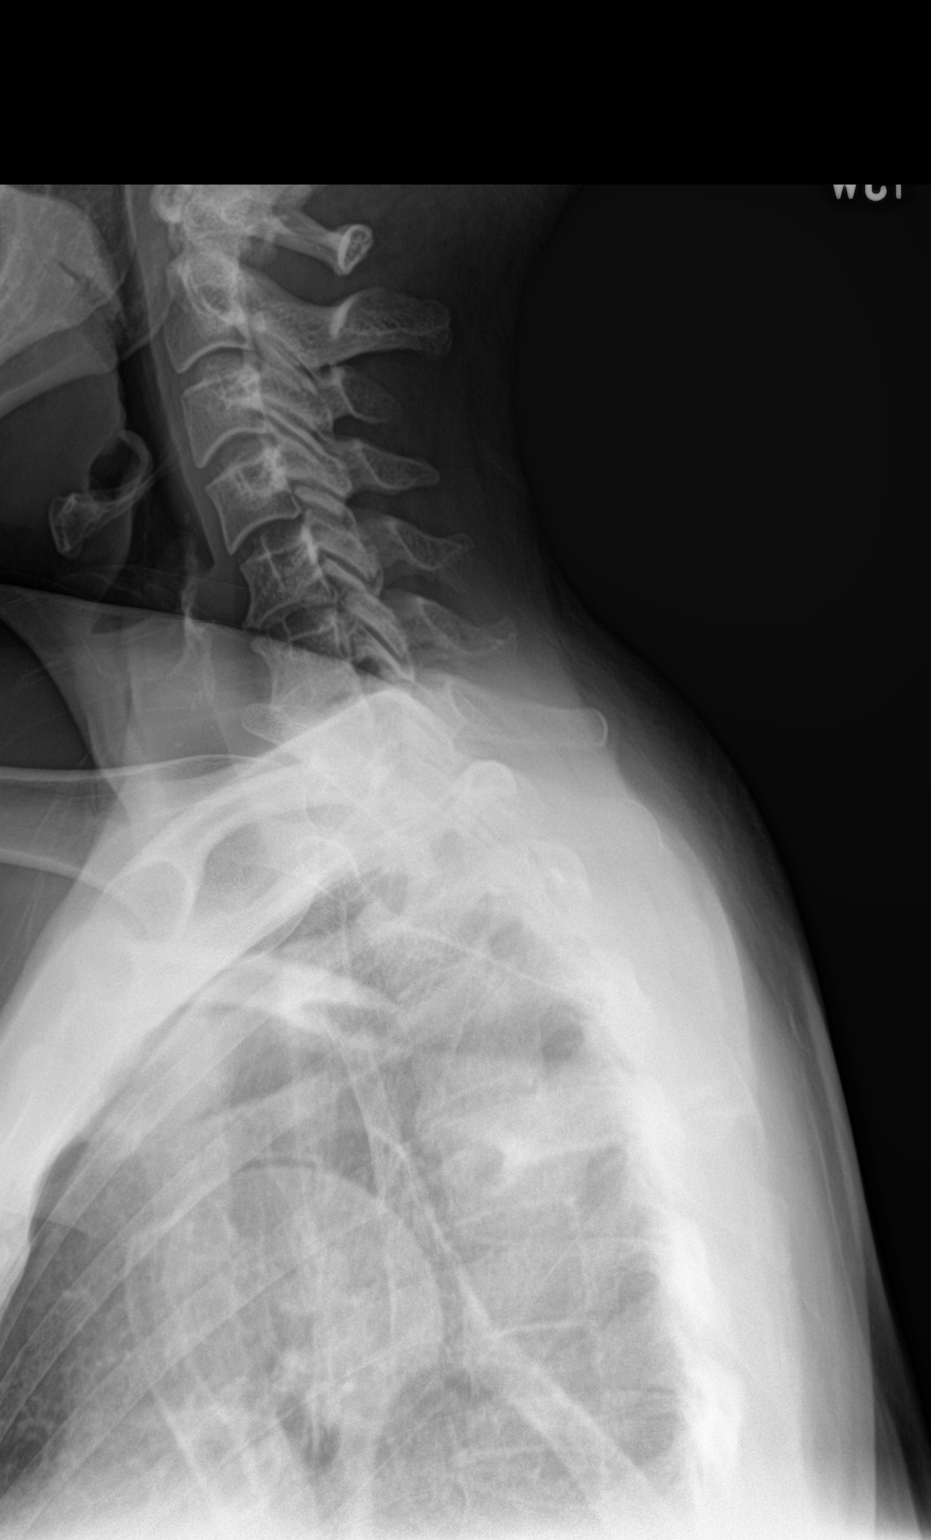

[3 of 3 positions shown; findings below may reference images not displayed]

FINDINGS: No acute bony abnormality identified. Normal alignment. Mild diffuse
degenerative change.
IMPRESSION: Mild diffuse degenerative change.  No acute abnormality.

## 2018-12-15 ENCOUNTER — Ambulatory Visit: Payer: Self-pay

## 2019-01-10 ENCOUNTER — Telehealth: Payer: Self-pay

## 2019-01-10 NOTE — Telephone Encounter (Signed)
Called pt earlier today for pre-visit BCCCP screening. She may not meet BCCCP eligibility now due to new health insurance policy but she was unable to provide details because she was at work. She asked for me to call back at 4pm after her shift. No answer when I called back. I left a message for her to return my call to verify eligibility and complete screening if eligible.

## 2019-01-12 ENCOUNTER — Encounter: Payer: Self-pay | Admitting: *Deleted

## 2019-01-12 ENCOUNTER — Other Ambulatory Visit: Payer: Self-pay

## 2019-01-12 ENCOUNTER — Ambulatory Visit
Admission: RE | Admit: 2019-01-12 | Discharge: 2019-01-12 | Disposition: A | Discharge status: Home / Self Care | Payer: Self-pay | Source: Ambulatory Visit | Attending: Oncology | Admitting: Oncology

## 2019-01-12 ENCOUNTER — Ambulatory Visit: Payer: Self-pay | Attending: Oncology | Admitting: *Deleted

## 2019-01-12 DIAGNOSIS — Z0000 Encounter for general adult medical examination without abnormal findings: Secondary | ICD-10-CM

## 2019-01-12 NOTE — Progress Notes (Signed)
Letter mailed from [facility] to inform patient of her normal mammogram results.  Patient is to follow-up with annual screening in one year. 

## 2019-01-12 NOTE — Progress Notes (Signed)
A televisit was used to enroll patient in to our BCCCP program due to the Covid 19 pandemic.  2 identifiers were used to identify correct patient.  Verbal consent given.  Health history completed.  Denies and any breast problems.  Patient may be due for her next pap.  She thinks her last pap was 3 years ago in [place].  I was able to find the last pap in Epic and it was in 2017 and was negative / negative.  Next pap due in 2022.  She was encouraged to discuss next pap with her primary care provider when she gets her insurance.  She is agreeable.  Screening mammogram ordered.  Will follow up per BCCCP protocol.

## 2019-01-27 ENCOUNTER — Ambulatory Visit: Payer: Self-pay | Admitting: Family Medicine

## 2019-03-16 ENCOUNTER — Ambulatory Visit (INDEPENDENT_AMBULATORY_CARE_PROVIDER_SITE_OTHER): Payer: 59 | Admitting: Family Medicine

## 2019-03-16 ENCOUNTER — Other Ambulatory Visit: Payer: Self-pay

## 2019-03-16 ENCOUNTER — Encounter: Payer: Self-pay | Admitting: Family Medicine

## 2019-03-16 VITALS — BP 118/68 | HR 72 | Temp 97.6°F | Ht 62.5 in | Wt 124.5 lb

## 2019-03-16 DIAGNOSIS — Z780 Asymptomatic menopausal state: Secondary | ICD-10-CM | POA: Diagnosis not present

## 2019-03-16 DIAGNOSIS — G43909 Migraine, unspecified, not intractable, without status migrainosus: Secondary | ICD-10-CM

## 2019-03-16 DIAGNOSIS — G4726 Circadian rhythm sleep disorder, shift work type: Secondary | ICD-10-CM | POA: Insufficient documentation

## 2019-03-16 DIAGNOSIS — E041 Nontoxic single thyroid nodule: Secondary | ICD-10-CM | POA: Diagnosis not present

## 2019-03-16 MED ORDER — RIZATRIPTAN BENZOATE 5 MG PO TABS: 5.0000 mg | ORAL_TABLET | ORAL | 6 refills | Status: AC | PRN

## 2019-03-16 NOTE — Assessment & Plan Note (Signed)
Update thyroid ultrasound as overdue (last checked 10/2016)

## 2019-03-16 NOTE — Assessment & Plan Note (Signed)
LMP 1 yr ago

## 2019-03-16 NOTE — Assessment & Plan Note (Addendum)
Worsened recently, trigger is poor sleep, shift work exacerbating this.  Will refill maxalt (taking with benefit) I will also provide letter to take to work to see if she has an option to change sift schedule.  Briefly discussed option of daily preventative medication - she declines for now.

## 2019-03-16 NOTE — Progress Notes (Signed)
This visit was conducted in person.  BP 118/68 (BP Location: Left Arm, Patient Position: Sitting, Cuff Size: Normal)   Pulse 72   Temp 97.6 F (36.4 C) (Temporal)   Ht 5' 2.5" (1.588 m)   Wt 124 lb 8 oz (56.5 kg)   SpO2 98%   BMI 22.41 kg/m    CC: migraines Subjective:    Patient ID: Denise Gordon, female    DOB: 1970/03/08, 49 y.o.   MRN: [id number]  HPI: Denise Gordon is a 49 y.o. female presenting on 03/16/2019 for Migraine (C/o migraine.  Worsened in last 3 mos since starting new job. )   Last seen 10/2016 - she moved to [place], but now back in the area. New job - working for [employer] - alternating shifts rotating every 2wks, struggles with poor sleep at night shift and has noted worsening migraines due to this. Requests work note regarding above. She takes melatonin and sleepy time teas to help sleep with some benefit.   Migraine headaches described as R>L temporal and occipital pressure headache associated with photo/phonophobia, nausea. They can be activity limiting. Can last all day or all night. Manages with tylenol and maxalt abortively. Having weekly migraine, but carries a less intense headache when she works 3rd shift. No aura with headache. Occasional vertigo with headache.   H/o menstrual migraines - previously well managed with relpax, we changed to maxalt due to insurance formulary.   Avoids caffeine.   Known L thyroid nodule - previously noted enlargement, lost to f/u. Due for imaging. Notes dry throat and hoarseness with prolonged talking.   LMP 1 yr ago.   Notes some axillary burning discomfort not associated with rash or exertion, present for the past year. No vision changes or other paresthesias.  No alcohol.  Replacing vit b12.      Relevant past medical, surgical, family and social history reviewed and updated as indicated. Interim medical history since our last visit reviewed. Allergies and medications reviewed and updated. Outpatient Medications Prior to  Visit  Medication Sig Dispense Refill  . acetaminophen (TYLENOL) 500 MG tablet Take 2 tablets (1,000 mg total) by mouth every 6 (six) hours as needed for mild pain or moderate pain. 6 tablet 0  . Multiple Vitamin (MULTIVITAMIN) capsule Take 1 capsule by mouth daily.    . vitamin B-12 (CYANOCOBALAMIN) 1000 MCG tablet Take 1 tablet (1,000 mcg total) by mouth daily.    . rizatriptan (MAXALT) 5 MG tablet Take 1 tablet (5 mg total) by mouth as needed for migraine. May repeat in 2 hours if needed 10 tablet 3  . diphenhydrAMINE (BENADRYL) 25 MG tablet Take 25 mg by mouth daily.    . fluticasone (FLONASE) 50 MCG/ACT nasal spray Place 1 spray into both nostrils 2 (two) times daily. (Patient not taking: Reported on 04/28/2017) 16 g 6  . loratadine (CLARITIN) 10 MG tablet Take 1 tablet (10 mg total) by mouth daily. (Patient not taking: Reported on 04/28/2017) 30 tablet 11  . sodium chloride (OCEAN) 0.65 % SOLN nasal spray Place 2 sprays into both nostrils every 2 (two) hours while awake.  0   No facility-administered medications prior to visit.     Per HPI unless specifically indicated in ROS section below Review of Systems Objective:    BP 118/68 (BP Location: Left Arm, Patient Position: Sitting, Cuff Size: Normal)   Pulse 72   Temp 97.6 F (36.4 C) (Temporal)   Ht 5' 2.5" (1.588 m)   [facility]  124 lb 8 oz (56.5 kg)   SpO2 98%   BMI 22.41 kg/m   Wt Readings from Last 3 Encounters:  03/16/19 124 lb 8 oz (56.5 kg)  12/07/17 117 lb (53.1 kg)  10/27/16 119 lb 8 oz (54.2 kg)    Physical Exam Vitals and nursing note reviewed.  Constitutional:      Appearance: Normal appearance. She is not ill-appearing.  HENT:     Mouth/Throat:     Mouth: Mucous membranes are moist.     Pharynx: Oropharynx is clear. No oropharyngeal exudate or posterior oropharyngeal erythema.  Eyes:     Extraocular Movements: Extraocular movements intact.     Pupils: Pupils are equal, round, and reactive to light.  Neck:      Thyroid: Thyroid mass (L nodule) present. No thyromegaly or thyroid tenderness.  Cardiovascular:     Rate and Rhythm: Normal rate and regular rhythm.     Pulses: Normal pulses.     Heart sounds: Normal heart sounds. No murmur.  Pulmonary:     Effort: Pulmonary effort is normal. No respiratory distress.     Breath sounds: Normal breath sounds. No wheezing, rhonchi or rales.  Musculoskeletal:     Cervical back: Normal range of motion.     Right lower leg: No edema.     Left lower leg: No edema.  Skin:    Capillary Refill: Capillary refill takes less than 2 seconds.     Findings: No rash.  Neurological:     General: No focal deficit present.     Mental Status: She is alert.     Cranial Nerves: Cranial nerves are intact.     Sensory: Sensation is intact.     Motor: Motor function is intact.     Coordination: Coordination is intact.     Comments:  CN 2-12 intact FTN intact EOMI  Psychiatric:        Mood and Affect: Mood normal.        Behavior: Behavior normal.       Results for orders placed or performed in visit on 05/01/17  Executive Panel  Result Value Ref Range   Glucose 75 65 - 99 mg/dL   Uric Acid 3.4 2.5 - 7.1 mg/dL   BUN 8 6 - 24 mg/dL   Creatinine, Ser 0.78 0.57 - 1.00 mg/dL   GFR calc non Af Amer 90 >59 mL/min/1.73   GFR calc Af Amer 104 >59 mL/min/1.73   BUN/Creatinine Ratio 10 9 - 23   Sodium 140 134 - 144 mmol/L   Potassium 4.3 3.5 - 5.2 mmol/L   Chloride 102 96 - 106 mmol/L   Calcium 9.2 8.7 - 10.2 mg/dL   Phosphorus 3.7 2.5 - 4.5 mg/dL   Total Protein 7.3 6.0 - 8.5 g/dL   Albumin 4.5 3.5 - 5.5 g/dL   Globulin, Total 2.8 1.5 - 4.5 g/dL   Albumin/Globulin Ratio 1.6 1.2 - 2.2   Bilirubin Total 0.4 0.0 - 1.2 mg/dL   Alkaline Phosphatase 70 39 - 117 IU/L   LDH 215 119 - 226 IU/L   AST 19 0 - 40 IU/L   ALT 10 0 - 32 IU/L   GGT 16 0 - 60 IU/L   Iron 92 27 - 159 ug/dL   Cholesterol, Total 210 (H) 100 - 199 mg/dL   Triglycerides 106 0 - 149 mg/dL   HDL 80  >39 mg/dL   VLDL Cholesterol Cal 21 5 - 40 mg/dL   LDL Calculated  109 (H) 0 - 99 mg/dL   Chol/HDL Ratio 2.6 0.0 - 4.4 ratio   Estimated CHD Risk  < 0.5 0.0 - 1.0 times avg.   TSH 0.714 0.450 - 4.500 uIU/mL   T4, Total 5.4 4.5 - 12.0 ug/dL   T3 Uptake Ratio 24 24 - 39 %   Free Thyroxine Index 1.3 1.2 - 4.9   WBC 6.9 3.4 - 10.8 x10E3/uL   RBC 4.32 3.77 - 5.28 x10E6/uL   Hemoglobin 13.1 11.1 - 15.9 g/dL   Hematocrit 40.5 34.0 - 46.6 %   MCV 94 79 - 97 fL   MCH 30.3 26.6 - 33.0 pg   MCHC 32.3 31.5 - 35.7 g/dL   RDW 14.1 12.3 - 15.4 %   Platelets 340 150 - 379 x10E3/uL   Neutrophils 67 Not Estab. %   Lymphs 24 Not Estab. %   Monocytes 7 Not Estab. %   Eos 2 Not Estab. %   Basos 0 Not Estab. %   Neutrophils Absolute 4.7 1.4 - 7.0 x10E3/uL   Lymphocytes Absolute 1.7 0.7 - 3.1 x10E3/uL   Monocytes Absolute 0.5 0.1 - 0.9 x10E3/uL   EOS (ABSOLUTE) 0.1 0.0 - 0.4 x10E3/uL   Basophils Absolute 0.0 0.0 - 0.2 x10E3/uL   Immature Granulocytes 0 Not Estab. %   Immature Grans (Abs) 0.0 0.0 - 0.1 x10E3/uL  Hemoglobin A1c  Result Value Ref Range   Hgb A1c MFr Bld 5.4 4.8 - 5.6 %   Assessment & Plan:  This visit occurred during the SARS-CoV-2 public health emergency.  Safety protocols were in place, including screening questions prior to the visit, additional usage of staff PPE, and extensive cleaning of exam room while observing appropriate contact time as indicated for disinfecting solutions.   Problem List Items Addressed This Visit    Shift work sleep disorder    Poor sleep acts a triggers for migraines.  Letter excuse for work provided today.       Migraine - Primary    Worsened recently, trigger is poor sleep, shift work exacerbating this.  Will refill maxalt (taking with benefit) I will also provide letter to take to work to see if she has an option to change sift schedule.  Briefly discussed option of daily preventative medication - she declines for now.       Relevant  Medications   rizatriptan (MAXALT) 5 MG tablet   Menopause    LMP 1 yr ago      Left thyroid nodule    Update thyroid ultrasound as overdue (last checked 10/2016)      Relevant Orders   US THYROID       Meds ordered this encounter  Medications  . rizatriptan (MAXALT) 5 MG tablet    Sig: Take 1 tablet (5 mg total) by mouth as needed for migraine. May repeat in 2 hours if needed    Dispense:  10 tablet    Refill:  6   Orders Placed This Encounter  Procedures  . US THYROID    Standing Status:   Future    Standing Expiration Date:   05/15/2020    Order Specific Question:   Reason for Exam (SYMPTOM  OR DIAGNOSIS REQUIRED)    Answer:   follow L thyroid nodule    Order Specific Question:   Preferred imaging location?    Answer:   [place]    Patient Instructions  Marin Comment haremos cita para sonograma de la tiroides.  Le escribire carta para  el [facility].  He rellenado maxalt.  Dejeme saber si empeora migraina para discutir medicina diara preventiva.    Follow up plan: Return if symptoms worsen or fail to improve.  Ria Bush, MD

## 2019-03-16 NOTE — Patient Instructions (Addendum)
Le haremos cita para sonograma de la tiroides.  Le escribire carta para Leander Rams.  He rellenado maxalt.  Dejeme saber si empeora migraina para discutir medicina diara preventiva.

## 2019-03-16 NOTE — Assessment & Plan Note (Signed)
Poor sleep acts a triggers for migraines.  Letter excuse for work provided today.

## 2019-03-23 ENCOUNTER — Telehealth: Payer: Self-pay

## 2019-03-23 NOTE — Telephone Encounter (Signed)
Left message on vm per dpr notifying pt she would need to get FMLA paperwork from her job and bring it to our office.

## 2019-03-23 NOTE — Telephone Encounter (Signed)
Pt returned Lisa's call. I relayed the message about getting the ppw from her employer and she verbalized understanding.

## 2019-03-23 NOTE — Telephone Encounter (Signed)
[place] Night - Client Nonclinical Telephone Record [facility] Client [facility] Night - Client Client Site [place] Physician Ria Bush - MD Contact Type Call Who Is Calling Patient / Member / Family / Caregiver Caller Name [place] Phone Number [phone] Patient Name Denise Gordon Patient DOB 06-Jan-1971 Call Type Message Only Information Provided Reason for Call Request for General Office Information Initial Comment Caller states she would like to speak with her Dr. She has a question about FMLA. She would like to know if she needs to get those from her job or if the office can provide them to her. Additional Comment Provided office hours. Disp. Time Disposition Final User 03/23/2019 7:49:08 AM General Information Provided Yes Rulon Eisenmenger Call Closed By: Rulon Eisenmenger Transaction Date/Time: 03/23/2019 7:45:10 AM (ET)

## 2019-03-23 NOTE — Telephone Encounter (Signed)
Robin at front desk is out of office today and thru the end of this week.FYI to [place] CMA.

## 2019-03-24 ENCOUNTER — Other Ambulatory Visit: Payer: 59

## 2019-09-28 ENCOUNTER — Telehealth: Payer: Self-pay

## 2019-09-28 NOTE — Telephone Encounter (Signed)
[place] Night - Client Nonclinical Telephone Record  [facility]urse Client [place] Night - Client Client Site [place] Physician Ria Bush - MD Contact Type Call Who Is Calling Patient / Member / Family / Caregiver Caller Name [place] Phone Number [phone] Patient Name Denise Gordon Patient DOB December 24, 1970 Call Type Message Only Information Provided Reason for Call Request to Schedule Office Appointment Initial Comment Caller requesting return call for appointment. No triage. Caller wants to know cost without insurance Additional Comment Office hours provided. Disp. Time Disposition Final User 09/28/2019 7:55:06 AM General Information Provided Yes Jaclyn Prime Call Closed By: Jaclyn Prime Transaction Date/Time: 09/28/2019 7:53:14 AM (ET)

## 2019-09-30 NOTE — Telephone Encounter (Signed)
Attempted to reach patient to see what she needs the cost of. Lvm asking her to call office.

## 2019-11-22 ENCOUNTER — Ambulatory Visit
Admission: RE | Admit: 2019-11-22 | Discharge: 2019-11-22 | Disposition: A | Discharge status: Home / Self Care | Payer: Self-pay | Source: Ambulatory Visit | Attending: Oncology | Admitting: Oncology

## 2019-11-22 ENCOUNTER — Other Ambulatory Visit: Payer: Self-pay

## 2019-11-22 ENCOUNTER — Encounter: Payer: Self-pay | Admitting: *Deleted

## 2019-11-22 ENCOUNTER — Ambulatory Visit: Payer: Self-pay | Attending: Oncology | Admitting: *Deleted

## 2019-11-22 VITALS — BP 124/61 | HR 67 | Temp 96.7°F | Ht 63.0 in | Wt 117.0 lb

## 2019-11-22 DIAGNOSIS — N644 Mastodynia: Secondary | ICD-10-CM

## 2019-11-22 DIAGNOSIS — Z0000 Encounter for general adult medical examination without abnormal findings: Secondary | ICD-10-CM

## 2019-11-22 NOTE — Patient Instructions (Signed)
Gave patient hand-out, Women Staying Healthy, Active and Well from [facility], with education on breast health, pap smears, heart and colon health. 

## 2019-11-22 NOTE — Progress Notes (Signed)
Subjective:     Patient ID: Denise Gordon, female   DOB: Oct 23, 1970, 49 y.o.   MRN: [id number]  HPI   [facility] Medical History Record - 11/21/19 1016      Breast History   Screening cycle New    Provider ([facility]) Dr. Danise Mina    Initial Mammogram 11/22/19    Last Mammogram Annual    Last Mammogram Date 01/12/19    Provider (Mammogram)  [facility] Poli    Recent Breast Symptoms Pain   no lump just pain and itching  x 3 months left breast and arm     Breast Cancer History   Breast Cancer History No personal or family history      Previous History of Breast Problems   Breast Surgery or Biopsy None    Breast Implants N/A    BSE Done Monthly      Gynecological/Obstetrical History   LMP --   greater than one year ago   Is there any chance that the client could be pregnant?  No    Age at menarche 51    Age at menopause 63    PAP smear history Annually    Date of last PAP  01/24/15    Provider (PAP) Clovia Cuff MD [facility]    Age at first live birth 37    Breast fed children Yes (type length in comments)   2 months   DES Exposure No    Cervical, Uterine or Ovarian cancer No    Family history of Cervial, Uterine or Ovarian cancer No    Hysterectomy No    Cervix removed No    Ovaries removed No    Laser/Cryosurgery No    Current method of birth control Other (see comments)   tubal ligation   Current method of Estrogen/Hormone replacement None    Smoking history None    Comments 2 in household; $20,000 per year             Review of Systems     Objective:   Physical Exam Chest:     Breasts:        Right: No swelling, bleeding, inverted nipple, mass, nipple discharge, skin change or tenderness.        Left: No swelling, bleeding, inverted nipple, mass, nipple discharge, skin change or tenderness.  Abdominal:     Palpations: There is no hepatomegaly or splenomegaly.  Genitourinary:    Labia:        Right: No rash, tenderness, lesion or injury.        Left: No rash, tenderness, lesion  or injury.      Urethra: No prolapse or urethral pain.     Vagina: No signs of injury and foreign body. No vaginal discharge, erythema, tenderness, bleeding, lesions or prolapsed vaginal walls.     Cervix: Friability present. No cervical motion tenderness, discharge or erythema.     Uterus: Not deviated, not enlarged, not fixed, not tender and no uterine prolapse.      Adnexa:        Right: No mass or tenderness.         Left: No mass or tenderness.       Rectum: No mass.     Lymphadenopathy:     Upper Body:     Right upper body: No supraclavicular or axillary adenopathy.     Left upper body: No supraclavicular or axillary adenopathy.        Assessment:     Patient presents  today with complaints of right axillary pain and right breast pain.  Patient states the pain has been present for about 3 months.  States the axillary pain "is difficult to describe".  States sleeping on her right side makes it worse and causes her right shoulder and upper arm "to go to sleep".  Asked if going to sleep felt like numbness and tingling and she states yes.  States her right breast around the areola has itching and burning.  States both are intermittent.  No alleviating factors.  States she has not tried anything.  She works in an office on a computer.  She is right handed.  On clinical breast exam there is no dominant mass, nipple discharge, skin changes or lymphadenopathy.  Specimen collected for pap smear without difficulty. Patient has been screened for eligibility.  She does not have any insurance, Medicare or Medicaid.  She also meets financial eligibility.   Risk Assessment    Risk Scores      11/22/2019 01/12/2019   Last edited by: Theodore Demark, RN Rico Junker, RN   5-year risk: 0.6 % 0.6 %   Lifetime risk: 5.7 % 5.8 %        Tyrer-Cuzick breast cancer risk assessment with a lifetime risk of 7.8%.  Per NCCN guidelines no modified imaging or genetic testing is recommended.    Plan:       Will get bilateral diagnostic mammogram and ultrasound.  Discussed with patient that I really think the breast pain is most likely related to a pinched nerve or other neurological etiology due to her description of pain, and if no findings on imaging I would recommend that she follow up with her PCP for further evaluation.  She is agreeable to the plan.  Specimen for pap sent to the lab.  Will follow up per [facility] protocol.

## 2019-11-28 LAB — IGP, APTIMA HPV: HPV Aptima: NEGATIVE

## 2019-11-29 ENCOUNTER — Encounter: Payer: Self-pay | Admitting: *Deleted

## 2019-11-29 NOTE — Progress Notes (Signed)
Called patient and reviewed normal mammogram results and need to repeat pap for insufficent cellularity.  States she is still have the axillary pain.  Encouraged her to follow up with her PCP for further evaluation.   I have scheduled her to return on February 21, 2020 @ 8:30 for repeat pap smear.  She is agreeable.

## 2019-12-20 ENCOUNTER — Encounter: Payer: Self-pay | Admitting: *Deleted

## 2020-01-17 ENCOUNTER — Ambulatory Visit: Payer: 59

## 2020-02-21 ENCOUNTER — Ambulatory Visit: Payer: Self-pay

## 2020-02-21 ENCOUNTER — Ambulatory Visit: Payer: Self-pay | Attending: Oncology
